# Patient Record
Sex: Female | Born: 1950
Health system: Southern US, Community
[De-identification: ages and names within clinical notes are randomized; demographics above are authoritative.]

## PROBLEM LIST (undated history)

## (undated) DIAGNOSIS — Z9889 Other specified postprocedural states: Secondary | ICD-10-CM

## (undated) DIAGNOSIS — R112 Nausea with vomiting, unspecified: Secondary | ICD-10-CM

## (undated) DIAGNOSIS — T8859XA Other complications of anesthesia, initial encounter: Secondary | ICD-10-CM

## (undated) DIAGNOSIS — E7800 Pure hypercholesterolemia, unspecified: Secondary | ICD-10-CM

## (undated) DIAGNOSIS — I10 Essential (primary) hypertension: Secondary | ICD-10-CM

## (undated) HISTORY — PX: WISDOM TOOTH EXTRACTION: SHX21

## (undated) HISTORY — PX: REPLACEMENT TOTAL KNEE: SUR1224

---

## 2010-09-26 ENCOUNTER — Encounter: Payer: Self-pay | Admitting: Podiatry

## 2013-07-07 ENCOUNTER — Ambulatory Visit (INDEPENDENT_AMBULATORY_CARE_PROVIDER_SITE_OTHER): Payer: Federal, State, Local not specified - PPO | Admitting: Podiatry

## 2013-07-07 VITALS — BP 123/79 | HR 78 | Resp 16

## 2013-07-07 DIAGNOSIS — M7750 Other enthesopathy of unspecified foot: Secondary | ICD-10-CM

## 2013-07-07 MED ORDER — TRIAMCINOLONE ACETONIDE 10 MG/ML IJ SUSP
10.0000 mg | Freq: Once | INTRAMUSCULAR | Status: AC
  Administered 2013-07-07: 10 mg

## 2013-07-07 NOTE — Progress Notes (Signed)
   Subjective:    Patient ID: Debra Mcbride, female    DOB: [birth year], 63 y.o.   MRN: [id number]  HPI  Pain in tops of feet bilaterally, worsens with walking, cortisone shot tx in past was effective   Review of Systems     Objective:   Physical Exam        Assessment & Plan:

## 2013-07-07 NOTE — Progress Notes (Signed)
Subjective:     Patient ID: Debra Mcbride, female   DOB: [birth year], 63 y.o.   MRN: [id number]  HPI patient presents that I'm having a lot of pain in the top of both feet that previous injection was helpful for   Review of Systems     Objective:   Physical Exam Neurological sensation intact with patient well oriented x3 with inflammation and pain midtarsal joint of both feet with fluid buildup around the extensor tendon complex    Assessment:     Extender tendinitis of both feet with inflammation and pain    Plan:     Injected the dorsal extensor complex of both feet 3 mg Kenalog 5 mg Xylocaine Marcaine mixture advised on physical therapy for this area and wider shoe gear

## 2014-05-03 ENCOUNTER — Ambulatory Visit
Admission: RE | Admit: 2014-05-03 | Discharge: 2014-05-03 | Disposition: A | Discharge status: Home / Self Care | Payer: Federal, State, Local not specified - PPO | Source: Ambulatory Visit | Attending: Physician Assistant | Admitting: Physician Assistant

## 2014-05-03 ENCOUNTER — Other Ambulatory Visit: Payer: Self-pay | Admitting: Physician Assistant

## 2014-05-03 DIAGNOSIS — S61451A Open bite of right hand, initial encounter: Secondary | ICD-10-CM

## 2014-05-03 DIAGNOSIS — W5501XA Bitten by cat, initial encounter: Principal | ICD-10-CM

## 2014-10-18 DIAGNOSIS — L0390 Cellulitis, unspecified: Secondary | ICD-10-CM | POA: Diagnosis not present

## 2014-11-06 DIAGNOSIS — L02511 Cutaneous abscess of right hand: Secondary | ICD-10-CM | POA: Diagnosis not present

## 2014-11-22 DIAGNOSIS — L02511 Cutaneous abscess of right hand: Secondary | ICD-10-CM | POA: Diagnosis not present

## 2014-11-22 DIAGNOSIS — L03818 Cellulitis of other sites: Secondary | ICD-10-CM | POA: Diagnosis not present

## 2014-12-01 DIAGNOSIS — L03019 Cellulitis of unspecified finger: Secondary | ICD-10-CM | POA: Diagnosis not present

## 2014-12-05 DIAGNOSIS — M79644 Pain in right finger(s): Secondary | ICD-10-CM | POA: Diagnosis not present

## 2014-12-05 DIAGNOSIS — M150 Primary generalized (osteo)arthritis: Secondary | ICD-10-CM | POA: Diagnosis not present

## 2014-12-05 DIAGNOSIS — M0560 Rheumatoid arthritis of unspecified site with involvement of other organs and systems: Secondary | ICD-10-CM | POA: Diagnosis not present

## 2014-12-05 DIAGNOSIS — L089 Local infection of the skin and subcutaneous tissue, unspecified: Secondary | ICD-10-CM | POA: Diagnosis not present

## 2014-12-05 DIAGNOSIS — R768 Other specified abnormal immunological findings in serum: Secondary | ICD-10-CM | POA: Diagnosis not present

## 2015-01-01 DIAGNOSIS — S61431A Puncture wound without foreign body of right hand, initial encounter: Secondary | ICD-10-CM | POA: Diagnosis not present

## 2015-01-01 DIAGNOSIS — M86641 Other chronic osteomyelitis, right hand: Secondary | ICD-10-CM | POA: Diagnosis not present

## 2015-01-08 DIAGNOSIS — M7989 Other specified soft tissue disorders: Secondary | ICD-10-CM | POA: Diagnosis not present

## 2015-01-09 DIAGNOSIS — M79641 Pain in right hand: Secondary | ICD-10-CM | POA: Diagnosis not present

## 2015-01-15 DIAGNOSIS — S61431A Puncture wound without foreign body of right hand, initial encounter: Secondary | ICD-10-CM | POA: Diagnosis not present

## 2015-01-15 DIAGNOSIS — M86641 Other chronic osteomyelitis, right hand: Secondary | ICD-10-CM | POA: Diagnosis not present

## 2015-02-12 DIAGNOSIS — M86641 Other chronic osteomyelitis, right hand: Secondary | ICD-10-CM | POA: Diagnosis not present

## 2015-03-01 DIAGNOSIS — M79671 Pain in right foot: Secondary | ICD-10-CM | POA: Diagnosis not present

## 2015-03-01 DIAGNOSIS — R768 Other specified abnormal immunological findings in serum: Secondary | ICD-10-CM | POA: Diagnosis not present

## 2015-03-01 DIAGNOSIS — M150 Primary generalized (osteo)arthritis: Secondary | ICD-10-CM | POA: Diagnosis not present

## 2015-03-01 DIAGNOSIS — M0560 Rheumatoid arthritis of unspecified site with involvement of other organs and systems: Secondary | ICD-10-CM | POA: Diagnosis not present

## 2015-04-02 DIAGNOSIS — E7800 Pure hypercholesterolemia, unspecified: Secondary | ICD-10-CM | POA: Diagnosis not present

## 2015-04-02 DIAGNOSIS — I10 Essential (primary) hypertension: Secondary | ICD-10-CM | POA: Diagnosis not present

## 2015-04-02 DIAGNOSIS — G4700 Insomnia, unspecified: Secondary | ICD-10-CM | POA: Diagnosis not present

## 2015-04-16 DIAGNOSIS — Z1231 Encounter for screening mammogram for malignant neoplasm of breast: Secondary | ICD-10-CM | POA: Diagnosis not present

## 2015-04-16 DIAGNOSIS — Z780 Asymptomatic menopausal state: Secondary | ICD-10-CM | POA: Diagnosis not present

## 2015-04-25 DIAGNOSIS — H11442 Conjunctival cysts, left eye: Secondary | ICD-10-CM | POA: Diagnosis not present

## 2015-05-30 DIAGNOSIS — R768 Other specified abnormal immunological findings in serum: Secondary | ICD-10-CM | POA: Diagnosis not present

## 2015-05-30 DIAGNOSIS — M25552 Pain in left hip: Secondary | ICD-10-CM | POA: Diagnosis not present

## 2015-05-30 DIAGNOSIS — M150 Primary generalized (osteo)arthritis: Secondary | ICD-10-CM | POA: Diagnosis not present

## 2015-05-30 DIAGNOSIS — M2550 Pain in unspecified joint: Secondary | ICD-10-CM | POA: Diagnosis not present

## 2015-06-23 DIAGNOSIS — J019 Acute sinusitis, unspecified: Secondary | ICD-10-CM | POA: Diagnosis not present

## 2015-07-18 DIAGNOSIS — M25552 Pain in left hip: Secondary | ICD-10-CM | POA: Diagnosis not present

## 2015-07-18 DIAGNOSIS — M2550 Pain in unspecified joint: Secondary | ICD-10-CM | POA: Diagnosis not present

## 2015-07-18 DIAGNOSIS — R768 Other specified abnormal immunological findings in serum: Secondary | ICD-10-CM | POA: Diagnosis not present

## 2015-07-18 DIAGNOSIS — M5432 Sciatica, left side: Secondary | ICD-10-CM | POA: Diagnosis not present

## 2015-07-18 DIAGNOSIS — M150 Primary generalized (osteo)arthritis: Secondary | ICD-10-CM | POA: Diagnosis not present

## 2015-08-14 DIAGNOSIS — M1711 Unilateral primary osteoarthritis, right knee: Secondary | ICD-10-CM | POA: Diagnosis not present

## 2015-08-14 DIAGNOSIS — M2241 Chondromalacia patellae, right knee: Secondary | ICD-10-CM | POA: Diagnosis not present

## 2015-08-14 DIAGNOSIS — Z6832 Body mass index (BMI) 32.0-32.9, adult: Secondary | ICD-10-CM | POA: Diagnosis not present

## 2015-09-05 DIAGNOSIS — M2550 Pain in unspecified joint: Secondary | ICD-10-CM | POA: Diagnosis not present

## 2015-09-05 DIAGNOSIS — M79671 Pain in right foot: Secondary | ICD-10-CM | POA: Diagnosis not present

## 2015-09-05 DIAGNOSIS — R768 Other specified abnormal immunological findings in serum: Secondary | ICD-10-CM | POA: Diagnosis not present

## 2015-09-05 DIAGNOSIS — M150 Primary generalized (osteo)arthritis: Secondary | ICD-10-CM | POA: Diagnosis not present

## 2015-09-19 ENCOUNTER — Other Ambulatory Visit: Payer: Self-pay | Admitting: Physician Assistant

## 2015-09-19 DIAGNOSIS — M79671 Pain in right foot: Secondary | ICD-10-CM

## 2015-09-24 ENCOUNTER — Ambulatory Visit
Admission: RE | Admit: 2015-09-24 | Discharge: 2015-09-24 | Disposition: A | Discharge status: Home / Self Care | Payer: Medicare Other | Source: Ambulatory Visit | Attending: Physician Assistant | Admitting: Physician Assistant

## 2015-09-24 DIAGNOSIS — M79671 Pain in right foot: Secondary | ICD-10-CM | POA: Diagnosis not present

## 2015-09-27 ENCOUNTER — Other Ambulatory Visit: Payer: Federal, State, Local not specified - PPO

## 2015-09-27 DIAGNOSIS — S92354A Nondisplaced fracture of fifth metatarsal bone, right foot, initial encounter for closed fracture: Secondary | ICD-10-CM | POA: Diagnosis not present

## 2015-10-01 DIAGNOSIS — E7800 Pure hypercholesterolemia, unspecified: Secondary | ICD-10-CM | POA: Diagnosis not present

## 2015-10-01 DIAGNOSIS — G4700 Insomnia, unspecified: Secondary | ICD-10-CM | POA: Diagnosis not present

## 2015-10-01 DIAGNOSIS — I10 Essential (primary) hypertension: Secondary | ICD-10-CM | POA: Diagnosis not present

## 2015-10-01 DIAGNOSIS — F39 Unspecified mood [affective] disorder: Secondary | ICD-10-CM | POA: Diagnosis not present

## 2015-10-01 DIAGNOSIS — K580 Irritable bowel syndrome with diarrhea: Secondary | ICD-10-CM | POA: Diagnosis not present

## 2015-10-01 DIAGNOSIS — Z23 Encounter for immunization: Secondary | ICD-10-CM | POA: Diagnosis not present

## 2015-10-01 DIAGNOSIS — Z0000 Encounter for general adult medical examination without abnormal findings: Secondary | ICD-10-CM | POA: Diagnosis not present

## 2015-10-15 DIAGNOSIS — M150 Primary generalized (osteo)arthritis: Secondary | ICD-10-CM | POA: Diagnosis not present

## 2015-10-15 DIAGNOSIS — M791 Myalgia: Secondary | ICD-10-CM | POA: Diagnosis not present

## 2015-10-15 DIAGNOSIS — R768 Other specified abnormal immunological findings in serum: Secondary | ICD-10-CM | POA: Diagnosis not present

## 2015-10-15 DIAGNOSIS — M79671 Pain in right foot: Secondary | ICD-10-CM | POA: Diagnosis not present

## 2015-10-15 DIAGNOSIS — M2550 Pain in unspecified joint: Secondary | ICD-10-CM | POA: Diagnosis not present

## 2015-10-19 DIAGNOSIS — S92354D Nondisplaced fracture of fifth metatarsal bone, right foot, subsequent encounter for fracture with routine healing: Secondary | ICD-10-CM | POA: Diagnosis not present

## 2015-11-14 DIAGNOSIS — S92354D Nondisplaced fracture of fifth metatarsal bone, right foot, subsequent encounter for fracture with routine healing: Secondary | ICD-10-CM | POA: Diagnosis not present

## 2015-12-03 DIAGNOSIS — M1711 Unilateral primary osteoarthritis, right knee: Secondary | ICD-10-CM | POA: Diagnosis not present

## 2016-01-22 DIAGNOSIS — Z23 Encounter for immunization: Secondary | ICD-10-CM | POA: Diagnosis not present

## 2016-01-29 DIAGNOSIS — M150 Primary generalized (osteo)arthritis: Secondary | ICD-10-CM | POA: Diagnosis not present

## 2016-01-29 DIAGNOSIS — R768 Other specified abnormal immunological findings in serum: Secondary | ICD-10-CM | POA: Diagnosis not present

## 2016-01-29 DIAGNOSIS — E669 Obesity, unspecified: Secondary | ICD-10-CM | POA: Diagnosis not present

## 2016-01-29 DIAGNOSIS — M79671 Pain in right foot: Secondary | ICD-10-CM | POA: Diagnosis not present

## 2016-01-29 DIAGNOSIS — Z6832 Body mass index (BMI) 32.0-32.9, adult: Secondary | ICD-10-CM | POA: Diagnosis not present

## 2016-01-29 DIAGNOSIS — M791 Myalgia: Secondary | ICD-10-CM | POA: Diagnosis not present

## 2016-01-29 DIAGNOSIS — M2550 Pain in unspecified joint: Secondary | ICD-10-CM | POA: Diagnosis not present

## 2016-02-04 DIAGNOSIS — M1711 Unilateral primary osteoarthritis, right knee: Secondary | ICD-10-CM | POA: Diagnosis not present

## 2016-02-04 DIAGNOSIS — I10 Essential (primary) hypertension: Secondary | ICD-10-CM | POA: Diagnosis not present

## 2016-02-13 DIAGNOSIS — Z8614 Personal history of Methicillin resistant Staphylococcus aureus infection: Secondary | ICD-10-CM | POA: Diagnosis not present

## 2016-02-13 DIAGNOSIS — I10 Essential (primary) hypertension: Secondary | ICD-10-CM | POA: Diagnosis present

## 2016-02-13 DIAGNOSIS — M1711 Unilateral primary osteoarthritis, right knee: Secondary | ICD-10-CM | POA: Diagnosis not present

## 2016-02-13 DIAGNOSIS — K219 Gastro-esophageal reflux disease without esophagitis: Secondary | ICD-10-CM | POA: Diagnosis present

## 2016-02-13 DIAGNOSIS — M25362 Other instability, left knee: Secondary | ICD-10-CM | POA: Diagnosis present

## 2016-02-13 DIAGNOSIS — Z96651 Presence of right artificial knee joint: Secondary | ICD-10-CM | POA: Diagnosis not present

## 2016-02-13 DIAGNOSIS — G8918 Other acute postprocedural pain: Secondary | ICD-10-CM | POA: Diagnosis not present

## 2016-02-13 DIAGNOSIS — R2689 Other abnormalities of gait and mobility: Secondary | ICD-10-CM | POA: Diagnosis not present

## 2016-02-13 DIAGNOSIS — Z471 Aftercare following joint replacement surgery: Secondary | ICD-10-CM | POA: Diagnosis not present

## 2016-02-13 DIAGNOSIS — F419 Anxiety disorder, unspecified: Secondary | ICD-10-CM | POA: Diagnosis present

## 2016-02-13 DIAGNOSIS — M21061 Valgus deformity, not elsewhere classified, right knee: Secondary | ICD-10-CM | POA: Diagnosis present

## 2016-02-13 DIAGNOSIS — M6281 Muscle weakness (generalized): Secondary | ICD-10-CM | POA: Diagnosis not present

## 2016-02-15 DIAGNOSIS — Z471 Aftercare following joint replacement surgery: Secondary | ICD-10-CM | POA: Diagnosis not present

## 2016-02-15 DIAGNOSIS — R2689 Other abnormalities of gait and mobility: Secondary | ICD-10-CM | POA: Diagnosis not present

## 2016-02-15 DIAGNOSIS — I10 Essential (primary) hypertension: Secondary | ICD-10-CM | POA: Diagnosis not present

## 2016-02-15 DIAGNOSIS — Z96651 Presence of right artificial knee joint: Secondary | ICD-10-CM | POA: Diagnosis not present

## 2016-02-15 DIAGNOSIS — M6281 Muscle weakness (generalized): Secondary | ICD-10-CM | POA: Diagnosis not present

## 2016-02-18 DIAGNOSIS — R2689 Other abnormalities of gait and mobility: Secondary | ICD-10-CM | POA: Diagnosis not present

## 2016-02-18 DIAGNOSIS — I10 Essential (primary) hypertension: Secondary | ICD-10-CM | POA: Diagnosis not present

## 2016-02-18 DIAGNOSIS — Z471 Aftercare following joint replacement surgery: Secondary | ICD-10-CM | POA: Diagnosis not present

## 2016-02-18 DIAGNOSIS — Z96651 Presence of right artificial knee joint: Secondary | ICD-10-CM | POA: Diagnosis not present

## 2016-02-18 DIAGNOSIS — M6281 Muscle weakness (generalized): Secondary | ICD-10-CM | POA: Diagnosis not present

## 2016-02-20 DIAGNOSIS — M6281 Muscle weakness (generalized): Secondary | ICD-10-CM | POA: Diagnosis not present

## 2016-02-20 DIAGNOSIS — I10 Essential (primary) hypertension: Secondary | ICD-10-CM | POA: Diagnosis not present

## 2016-02-20 DIAGNOSIS — Z471 Aftercare following joint replacement surgery: Secondary | ICD-10-CM | POA: Diagnosis not present

## 2016-02-20 DIAGNOSIS — R2689 Other abnormalities of gait and mobility: Secondary | ICD-10-CM | POA: Diagnosis not present

## 2016-02-20 DIAGNOSIS — Z96651 Presence of right artificial knee joint: Secondary | ICD-10-CM | POA: Diagnosis not present

## 2016-02-22 DIAGNOSIS — I10 Essential (primary) hypertension: Secondary | ICD-10-CM | POA: Diagnosis not present

## 2016-02-22 DIAGNOSIS — Z471 Aftercare following joint replacement surgery: Secondary | ICD-10-CM | POA: Diagnosis not present

## 2016-02-22 DIAGNOSIS — M6281 Muscle weakness (generalized): Secondary | ICD-10-CM | POA: Diagnosis not present

## 2016-02-22 DIAGNOSIS — Z96651 Presence of right artificial knee joint: Secondary | ICD-10-CM | POA: Diagnosis not present

## 2016-02-22 DIAGNOSIS — R2689 Other abnormalities of gait and mobility: Secondary | ICD-10-CM | POA: Diagnosis not present

## 2016-02-26 DIAGNOSIS — Z96651 Presence of right artificial knee joint: Secondary | ICD-10-CM | POA: Diagnosis not present

## 2016-02-26 DIAGNOSIS — I10 Essential (primary) hypertension: Secondary | ICD-10-CM | POA: Diagnosis not present

## 2016-02-26 DIAGNOSIS — R2689 Other abnormalities of gait and mobility: Secondary | ICD-10-CM | POA: Diagnosis not present

## 2016-02-26 DIAGNOSIS — Z471 Aftercare following joint replacement surgery: Secondary | ICD-10-CM | POA: Diagnosis not present

## 2016-02-26 DIAGNOSIS — M6281 Muscle weakness (generalized): Secondary | ICD-10-CM | POA: Diagnosis not present

## 2016-02-28 DIAGNOSIS — Z471 Aftercare following joint replacement surgery: Secondary | ICD-10-CM | POA: Diagnosis not present

## 2016-02-28 DIAGNOSIS — M6281 Muscle weakness (generalized): Secondary | ICD-10-CM | POA: Diagnosis not present

## 2016-02-28 DIAGNOSIS — Z96651 Presence of right artificial knee joint: Secondary | ICD-10-CM | POA: Diagnosis not present

## 2016-02-28 DIAGNOSIS — R2689 Other abnormalities of gait and mobility: Secondary | ICD-10-CM | POA: Diagnosis not present

## 2016-02-28 DIAGNOSIS — I10 Essential (primary) hypertension: Secondary | ICD-10-CM | POA: Diagnosis not present

## 2016-02-29 DIAGNOSIS — I10 Essential (primary) hypertension: Secondary | ICD-10-CM | POA: Diagnosis not present

## 2016-02-29 DIAGNOSIS — M6281 Muscle weakness (generalized): Secondary | ICD-10-CM | POA: Diagnosis not present

## 2016-02-29 DIAGNOSIS — Z96651 Presence of right artificial knee joint: Secondary | ICD-10-CM | POA: Diagnosis not present

## 2016-02-29 DIAGNOSIS — R2689 Other abnormalities of gait and mobility: Secondary | ICD-10-CM | POA: Diagnosis not present

## 2016-02-29 DIAGNOSIS — Z471 Aftercare following joint replacement surgery: Secondary | ICD-10-CM | POA: Diagnosis not present

## 2016-03-04 DIAGNOSIS — Z96651 Presence of right artificial knee joint: Secondary | ICD-10-CM | POA: Diagnosis not present

## 2016-03-04 DIAGNOSIS — M6281 Muscle weakness (generalized): Secondary | ICD-10-CM | POA: Diagnosis not present

## 2016-03-04 DIAGNOSIS — R2689 Other abnormalities of gait and mobility: Secondary | ICD-10-CM | POA: Diagnosis not present

## 2016-03-04 DIAGNOSIS — Z471 Aftercare following joint replacement surgery: Secondary | ICD-10-CM | POA: Diagnosis not present

## 2016-03-04 DIAGNOSIS — I10 Essential (primary) hypertension: Secondary | ICD-10-CM | POA: Diagnosis not present

## 2016-03-05 DIAGNOSIS — Z471 Aftercare following joint replacement surgery: Secondary | ICD-10-CM | POA: Diagnosis not present

## 2016-03-05 DIAGNOSIS — M6281 Muscle weakness (generalized): Secondary | ICD-10-CM | POA: Diagnosis not present

## 2016-03-05 DIAGNOSIS — R2689 Other abnormalities of gait and mobility: Secondary | ICD-10-CM | POA: Diagnosis not present

## 2016-03-05 DIAGNOSIS — Z96651 Presence of right artificial knee joint: Secondary | ICD-10-CM | POA: Diagnosis not present

## 2016-03-05 DIAGNOSIS — I10 Essential (primary) hypertension: Secondary | ICD-10-CM | POA: Diagnosis not present

## 2016-03-07 DIAGNOSIS — R2689 Other abnormalities of gait and mobility: Secondary | ICD-10-CM | POA: Diagnosis not present

## 2016-03-07 DIAGNOSIS — Z96651 Presence of right artificial knee joint: Secondary | ICD-10-CM | POA: Diagnosis not present

## 2016-03-07 DIAGNOSIS — Z471 Aftercare following joint replacement surgery: Secondary | ICD-10-CM | POA: Diagnosis not present

## 2016-03-07 DIAGNOSIS — I10 Essential (primary) hypertension: Secondary | ICD-10-CM | POA: Diagnosis not present

## 2016-03-07 DIAGNOSIS — M6281 Muscle weakness (generalized): Secondary | ICD-10-CM | POA: Diagnosis not present

## 2016-03-10 DIAGNOSIS — R2689 Other abnormalities of gait and mobility: Secondary | ICD-10-CM | POA: Diagnosis not present

## 2016-03-10 DIAGNOSIS — Z96651 Presence of right artificial knee joint: Secondary | ICD-10-CM | POA: Diagnosis not present

## 2016-03-10 DIAGNOSIS — Z471 Aftercare following joint replacement surgery: Secondary | ICD-10-CM | POA: Diagnosis not present

## 2016-03-10 DIAGNOSIS — I10 Essential (primary) hypertension: Secondary | ICD-10-CM | POA: Diagnosis not present

## 2016-03-10 DIAGNOSIS — M6281 Muscle weakness (generalized): Secondary | ICD-10-CM | POA: Diagnosis not present

## 2016-03-12 DIAGNOSIS — Z471 Aftercare following joint replacement surgery: Secondary | ICD-10-CM | POA: Diagnosis not present

## 2016-03-12 DIAGNOSIS — R2689 Other abnormalities of gait and mobility: Secondary | ICD-10-CM | POA: Diagnosis not present

## 2016-03-12 DIAGNOSIS — M6281 Muscle weakness (generalized): Secondary | ICD-10-CM | POA: Diagnosis not present

## 2016-03-12 DIAGNOSIS — Z96651 Presence of right artificial knee joint: Secondary | ICD-10-CM | POA: Diagnosis not present

## 2016-03-12 DIAGNOSIS — I10 Essential (primary) hypertension: Secondary | ICD-10-CM | POA: Diagnosis not present

## 2016-03-17 DIAGNOSIS — M25661 Stiffness of right knee, not elsewhere classified: Secondary | ICD-10-CM | POA: Diagnosis not present

## 2016-03-17 DIAGNOSIS — M25561 Pain in right knee: Secondary | ICD-10-CM | POA: Diagnosis not present

## 2016-03-17 DIAGNOSIS — R29898 Other symptoms and signs involving the musculoskeletal system: Secondary | ICD-10-CM | POA: Diagnosis not present

## 2016-03-21 DIAGNOSIS — M25561 Pain in right knee: Secondary | ICD-10-CM | POA: Diagnosis not present

## 2016-03-21 DIAGNOSIS — R29898 Other symptoms and signs involving the musculoskeletal system: Secondary | ICD-10-CM | POA: Diagnosis not present

## 2016-03-21 DIAGNOSIS — M25661 Stiffness of right knee, not elsewhere classified: Secondary | ICD-10-CM | POA: Diagnosis not present

## 2016-03-26 DIAGNOSIS — M25661 Stiffness of right knee, not elsewhere classified: Secondary | ICD-10-CM | POA: Diagnosis not present

## 2016-03-26 DIAGNOSIS — R29898 Other symptoms and signs involving the musculoskeletal system: Secondary | ICD-10-CM | POA: Diagnosis not present

## 2016-03-26 DIAGNOSIS — M25561 Pain in right knee: Secondary | ICD-10-CM | POA: Diagnosis not present

## 2016-03-27 DIAGNOSIS — Z471 Aftercare following joint replacement surgery: Secondary | ICD-10-CM | POA: Diagnosis not present

## 2016-03-27 DIAGNOSIS — Z96651 Presence of right artificial knee joint: Secondary | ICD-10-CM | POA: Diagnosis not present

## 2016-03-31 DIAGNOSIS — M79671 Pain in right foot: Secondary | ICD-10-CM | POA: Diagnosis not present

## 2016-03-31 DIAGNOSIS — R29898 Other symptoms and signs involving the musculoskeletal system: Secondary | ICD-10-CM | POA: Diagnosis not present

## 2016-03-31 DIAGNOSIS — M2550 Pain in unspecified joint: Secondary | ICD-10-CM | POA: Diagnosis not present

## 2016-03-31 DIAGNOSIS — Z6831 Body mass index (BMI) 31.0-31.9, adult: Secondary | ICD-10-CM | POA: Diagnosis not present

## 2016-03-31 DIAGNOSIS — M25561 Pain in right knee: Secondary | ICD-10-CM | POA: Diagnosis not present

## 2016-03-31 DIAGNOSIS — M150 Primary generalized (osteo)arthritis: Secondary | ICD-10-CM | POA: Diagnosis not present

## 2016-03-31 DIAGNOSIS — M25661 Stiffness of right knee, not elsewhere classified: Secondary | ICD-10-CM | POA: Diagnosis not present

## 2016-03-31 DIAGNOSIS — R768 Other specified abnormal immunological findings in serum: Secondary | ICD-10-CM | POA: Diagnosis not present

## 2016-03-31 DIAGNOSIS — E669 Obesity, unspecified: Secondary | ICD-10-CM | POA: Diagnosis not present

## 2016-04-18 DIAGNOSIS — I10 Essential (primary) hypertension: Secondary | ICD-10-CM | POA: Diagnosis not present

## 2016-04-18 DIAGNOSIS — F39 Unspecified mood [affective] disorder: Secondary | ICD-10-CM | POA: Diagnosis not present

## 2016-04-18 DIAGNOSIS — G4700 Insomnia, unspecified: Secondary | ICD-10-CM | POA: Diagnosis not present

## 2016-04-18 DIAGNOSIS — E7800 Pure hypercholesterolemia, unspecified: Secondary | ICD-10-CM | POA: Diagnosis not present

## 2016-07-09 DIAGNOSIS — R109 Unspecified abdominal pain: Secondary | ICD-10-CM | POA: Diagnosis not present

## 2016-07-16 DIAGNOSIS — J029 Acute pharyngitis, unspecified: Secondary | ICD-10-CM | POA: Diagnosis not present

## 2016-07-16 DIAGNOSIS — Z6831 Body mass index (BMI) 31.0-31.9, adult: Secondary | ICD-10-CM | POA: Diagnosis not present

## 2016-07-16 DIAGNOSIS — J019 Acute sinusitis, unspecified: Secondary | ICD-10-CM | POA: Diagnosis not present

## 2016-07-25 DIAGNOSIS — M62830 Muscle spasm of back: Secondary | ICD-10-CM | POA: Diagnosis not present

## 2016-07-31 DIAGNOSIS — M542 Cervicalgia: Secondary | ICD-10-CM | POA: Diagnosis not present

## 2016-07-31 DIAGNOSIS — M545 Low back pain: Secondary | ICD-10-CM | POA: Diagnosis not present

## 2016-07-31 DIAGNOSIS — M546 Pain in thoracic spine: Secondary | ICD-10-CM | POA: Diagnosis not present

## 2016-08-05 DIAGNOSIS — M546 Pain in thoracic spine: Secondary | ICD-10-CM | POA: Diagnosis not present

## 2016-08-05 DIAGNOSIS — M545 Low back pain: Secondary | ICD-10-CM | POA: Diagnosis not present

## 2016-08-05 DIAGNOSIS — M542 Cervicalgia: Secondary | ICD-10-CM | POA: Diagnosis not present

## 2016-08-11 DIAGNOSIS — M545 Low back pain: Secondary | ICD-10-CM | POA: Diagnosis not present

## 2016-08-11 DIAGNOSIS — M542 Cervicalgia: Secondary | ICD-10-CM | POA: Diagnosis not present

## 2016-08-11 DIAGNOSIS — M546 Pain in thoracic spine: Secondary | ICD-10-CM | POA: Diagnosis not present

## 2016-08-12 ENCOUNTER — Other Ambulatory Visit: Payer: Self-pay | Admitting: Family Medicine

## 2016-08-12 ENCOUNTER — Ambulatory Visit
Admission: RE | Admit: 2016-08-12 | Discharge: 2016-08-12 | Disposition: A | Discharge status: Home / Self Care | Payer: Medicare Other | Source: Ambulatory Visit | Attending: Family Medicine | Admitting: Family Medicine

## 2016-08-12 DIAGNOSIS — R059 Cough, unspecified: Secondary | ICD-10-CM

## 2016-08-12 DIAGNOSIS — R079 Chest pain, unspecified: Secondary | ICD-10-CM

## 2016-08-12 DIAGNOSIS — R05 Cough: Secondary | ICD-10-CM

## 2016-08-14 ENCOUNTER — Other Ambulatory Visit: Payer: Self-pay | Admitting: Physician Assistant

## 2016-08-14 DIAGNOSIS — M546 Pain in thoracic spine: Secondary | ICD-10-CM

## 2016-08-14 DIAGNOSIS — R109 Unspecified abdominal pain: Secondary | ICD-10-CM

## 2016-08-14 DIAGNOSIS — R8290 Unspecified abnormal findings in urine: Secondary | ICD-10-CM | POA: Diagnosis not present

## 2016-08-14 DIAGNOSIS — M545 Low back pain: Secondary | ICD-10-CM | POA: Diagnosis not present

## 2016-08-18 ENCOUNTER — Ambulatory Visit
Admission: RE | Admit: 2016-08-18 | Discharge: 2016-08-18 | Disposition: A | Discharge status: Home / Self Care | Payer: Medicare Other | Source: Ambulatory Visit | Attending: Physician Assistant | Admitting: Physician Assistant

## 2016-08-18 DIAGNOSIS — R079 Chest pain, unspecified: Secondary | ICD-10-CM | POA: Diagnosis not present

## 2016-08-18 DIAGNOSIS — R109 Unspecified abdominal pain: Secondary | ICD-10-CM

## 2016-08-18 DIAGNOSIS — K8020 Calculus of gallbladder without cholecystitis without obstruction: Secondary | ICD-10-CM | POA: Diagnosis not present

## 2016-08-18 DIAGNOSIS — M546 Pain in thoracic spine: Secondary | ICD-10-CM

## 2016-08-18 MED ORDER — IOPAMIDOL (ISOVUE-300) INJECTION 61%
100.0000 mL | Freq: Once | INTRAVENOUS | Status: AC | PRN
  Administered 2016-08-18: 100 mL via INTRAVENOUS

## 2016-09-02 DIAGNOSIS — M546 Pain in thoracic spine: Secondary | ICD-10-CM | POA: Diagnosis not present

## 2016-09-02 DIAGNOSIS — M545 Low back pain: Secondary | ICD-10-CM | POA: Diagnosis not present

## 2016-09-02 DIAGNOSIS — M542 Cervicalgia: Secondary | ICD-10-CM | POA: Diagnosis not present

## 2016-11-05 DIAGNOSIS — B372 Candidiasis of skin and nail: Secondary | ICD-10-CM | POA: Diagnosis not present

## 2016-11-05 DIAGNOSIS — R5383 Other fatigue: Secondary | ICD-10-CM | POA: Diagnosis not present

## 2016-11-05 DIAGNOSIS — R05 Cough: Secondary | ICD-10-CM | POA: Diagnosis not present

## 2016-11-05 DIAGNOSIS — L309 Dermatitis, unspecified: Secondary | ICD-10-CM | POA: Diagnosis not present

## 2016-11-05 DIAGNOSIS — I10 Essential (primary) hypertension: Secondary | ICD-10-CM | POA: Diagnosis not present

## 2016-11-05 DIAGNOSIS — R0609 Other forms of dyspnea: Secondary | ICD-10-CM | POA: Diagnosis not present

## 2016-11-07 DIAGNOSIS — R945 Abnormal results of liver function studies: Secondary | ICD-10-CM | POA: Diagnosis not present

## 2016-11-07 DIAGNOSIS — M2550 Pain in unspecified joint: Secondary | ICD-10-CM | POA: Diagnosis not present

## 2016-11-07 DIAGNOSIS — G4700 Insomnia, unspecified: Secondary | ICD-10-CM | POA: Diagnosis not present

## 2016-11-07 DIAGNOSIS — R5383 Other fatigue: Secondary | ICD-10-CM | POA: Diagnosis not present

## 2016-11-26 DIAGNOSIS — R945 Abnormal results of liver function studies: Secondary | ICD-10-CM | POA: Diagnosis not present

## 2016-12-09 DIAGNOSIS — Z1231 Encounter for screening mammogram for malignant neoplasm of breast: Secondary | ICD-10-CM | POA: Diagnosis not present

## 2016-12-18 DIAGNOSIS — B372 Candidiasis of skin and nail: Secondary | ICD-10-CM | POA: Diagnosis not present

## 2017-01-06 DIAGNOSIS — E669 Obesity, unspecified: Secondary | ICD-10-CM | POA: Diagnosis not present

## 2017-01-06 DIAGNOSIS — Z6832 Body mass index (BMI) 32.0-32.9, adult: Secondary | ICD-10-CM | POA: Diagnosis not present

## 2017-01-06 DIAGNOSIS — M2550 Pain in unspecified joint: Secondary | ICD-10-CM | POA: Diagnosis not present

## 2017-01-06 DIAGNOSIS — R768 Other specified abnormal immunological findings in serum: Secondary | ICD-10-CM | POA: Diagnosis not present

## 2017-01-06 DIAGNOSIS — M150 Primary generalized (osteo)arthritis: Secondary | ICD-10-CM | POA: Diagnosis not present

## 2017-01-06 DIAGNOSIS — M79671 Pain in right foot: Secondary | ICD-10-CM | POA: Diagnosis not present

## 2017-01-06 DIAGNOSIS — Z23 Encounter for immunization: Secondary | ICD-10-CM | POA: Diagnosis not present

## 2017-01-26 DIAGNOSIS — R0981 Nasal congestion: Secondary | ICD-10-CM | POA: Diagnosis not present

## 2017-01-26 DIAGNOSIS — G4700 Insomnia, unspecified: Secondary | ICD-10-CM | POA: Diagnosis not present

## 2017-01-26 DIAGNOSIS — I10 Essential (primary) hypertension: Secondary | ICD-10-CM | POA: Diagnosis not present

## 2017-01-26 DIAGNOSIS — Z23 Encounter for immunization: Secondary | ICD-10-CM | POA: Diagnosis not present

## 2017-01-26 DIAGNOSIS — E7800 Pure hypercholesterolemia, unspecified: Secondary | ICD-10-CM | POA: Diagnosis not present

## 2017-01-26 DIAGNOSIS — Z0000 Encounter for general adult medical examination without abnormal findings: Secondary | ICD-10-CM | POA: Diagnosis not present

## 2017-01-26 DIAGNOSIS — K580 Irritable bowel syndrome with diarrhea: Secondary | ICD-10-CM | POA: Diagnosis not present

## 2017-01-26 DIAGNOSIS — F39 Unspecified mood [affective] disorder: Secondary | ICD-10-CM | POA: Diagnosis not present

## 2017-01-26 DIAGNOSIS — M2550 Pain in unspecified joint: Secondary | ICD-10-CM | POA: Diagnosis not present

## 2017-01-26 DIAGNOSIS — Z1389 Encounter for screening for other disorder: Secondary | ICD-10-CM | POA: Diagnosis not present

## 2017-04-06 DIAGNOSIS — M2550 Pain in unspecified joint: Secondary | ICD-10-CM | POA: Diagnosis not present

## 2017-04-06 DIAGNOSIS — Z6832 Body mass index (BMI) 32.0-32.9, adult: Secondary | ICD-10-CM | POA: Diagnosis not present

## 2017-04-06 DIAGNOSIS — M79671 Pain in right foot: Secondary | ICD-10-CM | POA: Diagnosis not present

## 2017-04-06 DIAGNOSIS — M150 Primary generalized (osteo)arthritis: Secondary | ICD-10-CM | POA: Diagnosis not present

## 2017-04-06 DIAGNOSIS — Z79899 Other long term (current) drug therapy: Secondary | ICD-10-CM | POA: Diagnosis not present

## 2017-04-06 DIAGNOSIS — R768 Other specified abnormal immunological findings in serum: Secondary | ICD-10-CM | POA: Diagnosis not present

## 2017-05-14 DIAGNOSIS — D225 Melanocytic nevi of trunk: Secondary | ICD-10-CM | POA: Diagnosis not present

## 2017-05-14 DIAGNOSIS — L821 Other seborrheic keratosis: Secondary | ICD-10-CM | POA: Diagnosis not present

## 2017-05-14 DIAGNOSIS — L814 Other melanin hyperpigmentation: Secondary | ICD-10-CM | POA: Diagnosis not present

## 2017-05-14 DIAGNOSIS — D485 Neoplasm of uncertain behavior of skin: Secondary | ICD-10-CM | POA: Diagnosis not present

## 2017-06-12 DIAGNOSIS — R05 Cough: Secondary | ICD-10-CM | POA: Diagnosis not present

## 2017-06-12 DIAGNOSIS — J0100 Acute maxillary sinusitis, unspecified: Secondary | ICD-10-CM | POA: Diagnosis not present

## 2017-08-06 DIAGNOSIS — I872 Venous insufficiency (chronic) (peripheral): Secondary | ICD-10-CM | POA: Diagnosis not present

## 2017-08-06 DIAGNOSIS — E7800 Pure hypercholesterolemia, unspecified: Secondary | ICD-10-CM | POA: Diagnosis not present

## 2017-08-06 DIAGNOSIS — I10 Essential (primary) hypertension: Secondary | ICD-10-CM | POA: Diagnosis not present

## 2017-10-08 DIAGNOSIS — Z79899 Other long term (current) drug therapy: Secondary | ICD-10-CM | POA: Diagnosis not present

## 2017-10-08 DIAGNOSIS — R768 Other specified abnormal immunological findings in serum: Secondary | ICD-10-CM | POA: Diagnosis not present

## 2017-10-08 DIAGNOSIS — Z6832 Body mass index (BMI) 32.0-32.9, adult: Secondary | ICD-10-CM | POA: Diagnosis not present

## 2017-10-08 DIAGNOSIS — M79671 Pain in right foot: Secondary | ICD-10-CM | POA: Diagnosis not present

## 2017-10-08 DIAGNOSIS — M2550 Pain in unspecified joint: Secondary | ICD-10-CM | POA: Diagnosis not present

## 2017-10-08 DIAGNOSIS — M150 Primary generalized (osteo)arthritis: Secondary | ICD-10-CM | POA: Diagnosis not present

## 2018-01-04 DIAGNOSIS — Z1231 Encounter for screening mammogram for malignant neoplasm of breast: Secondary | ICD-10-CM | POA: Diagnosis not present

## 2018-04-07 DIAGNOSIS — Z79899 Other long term (current) drug therapy: Secondary | ICD-10-CM | POA: Diagnosis not present

## 2018-04-07 DIAGNOSIS — M2550 Pain in unspecified joint: Secondary | ICD-10-CM | POA: Diagnosis not present

## 2018-04-07 DIAGNOSIS — E669 Obesity, unspecified: Secondary | ICD-10-CM | POA: Diagnosis not present

## 2018-04-07 DIAGNOSIS — Z6833 Body mass index (BMI) 33.0-33.9, adult: Secondary | ICD-10-CM | POA: Diagnosis not present

## 2018-04-07 DIAGNOSIS — R768 Other specified abnormal immunological findings in serum: Secondary | ICD-10-CM | POA: Diagnosis not present

## 2018-04-07 DIAGNOSIS — M150 Primary generalized (osteo)arthritis: Secondary | ICD-10-CM | POA: Diagnosis not present

## 2018-04-07 DIAGNOSIS — M79671 Pain in right foot: Secondary | ICD-10-CM | POA: Diagnosis not present

## 2018-04-26 DIAGNOSIS — Z0000 Encounter for general adult medical examination without abnormal findings: Secondary | ICD-10-CM | POA: Diagnosis not present

## 2018-05-28 IMAGING — CT CT ABD-PELV W/ CM
2 of 5 series · 11 of 36 positions shown, 18 images · IV contrast (iopamidol)
Comparison: None.

CLINICAL DATA: 65 y/o F; left flank pain, left lower back pain,
thoracic pain, upper back pain.

EXAM:
CT CHEST, ABDOMEN, AND PELVIS WITH CONTRAST
TECHNIQUE: Multidetector CT imaging of the chest, abdomen and pelvis was
performed following the standard protocol during bolus
administration of intravenous contrast.
CONTRAST:  100mL W7B3AW-7MM IOPAMIDOL (W7B3AW-7MM) INJECTION 61%

[Series 601: coronal body · coronal · 1.12mm/px · 1 of 136 slices shown, 2 images]
[im 46/136  soft-tissue]
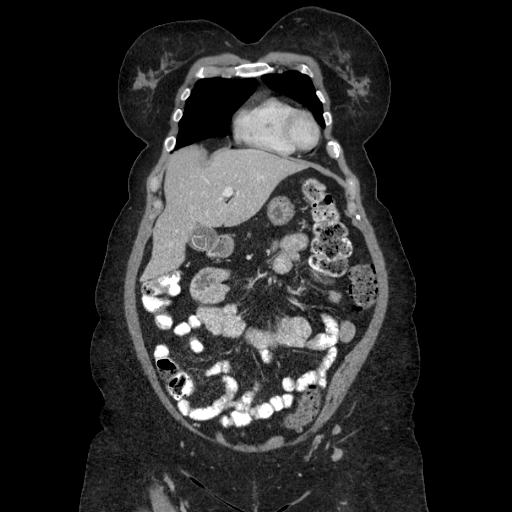
[im 46/136  bone]
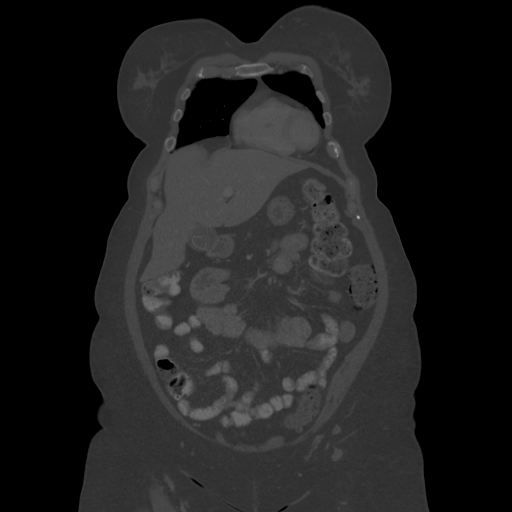

[Series 602: sagittal body · sagittal · 1.12mm/px · 10 of 189 slices shown, 16 images]
[im 16/189  soft-tissue]
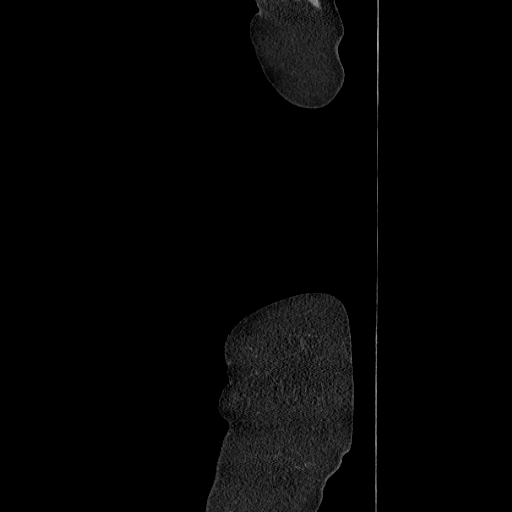
[im 16/189  lung]
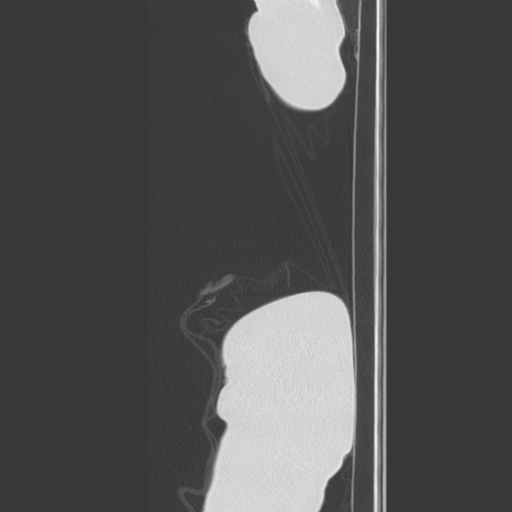
[im 16/189  bone]
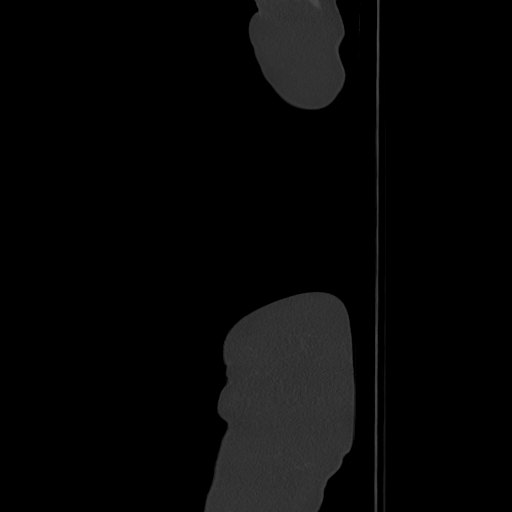
[im 32/189  soft-tissue]
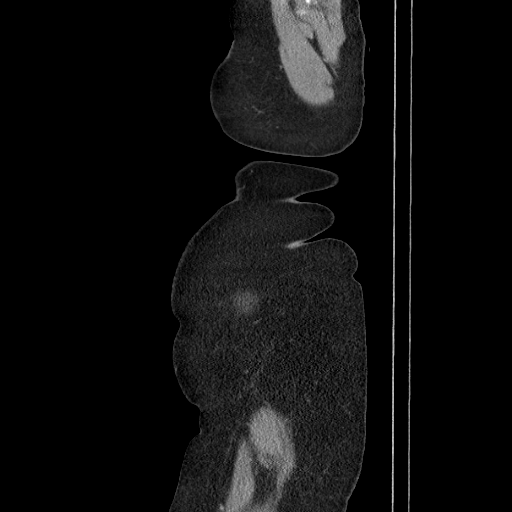
[im 32/189  lung]
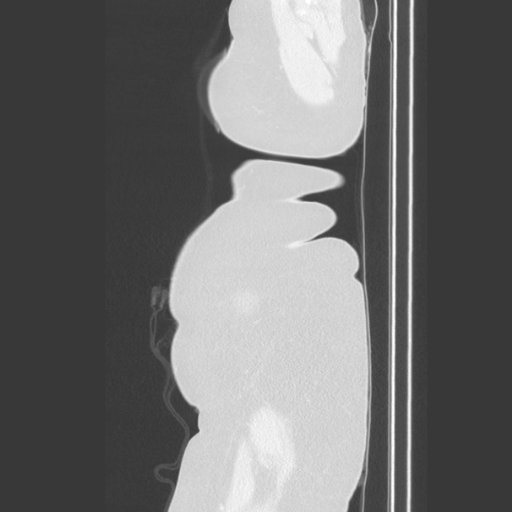
[im 48/189  soft-tissue]
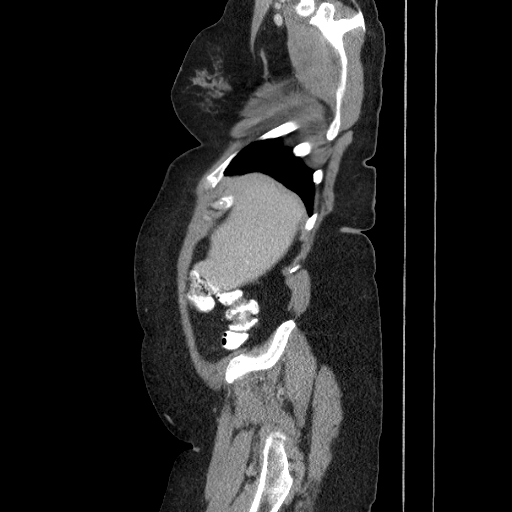
[im 48/189  lung]
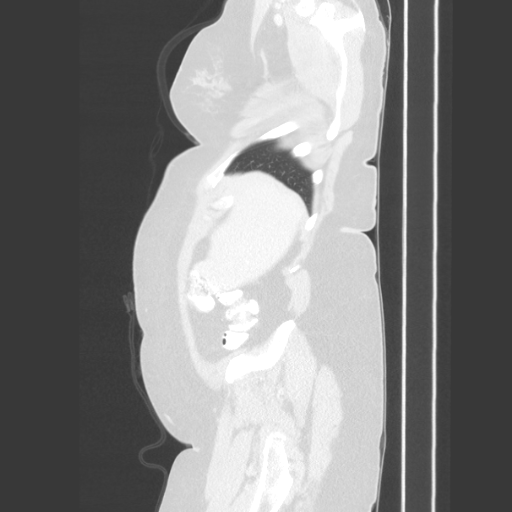
[im 63/189  soft-tissue]
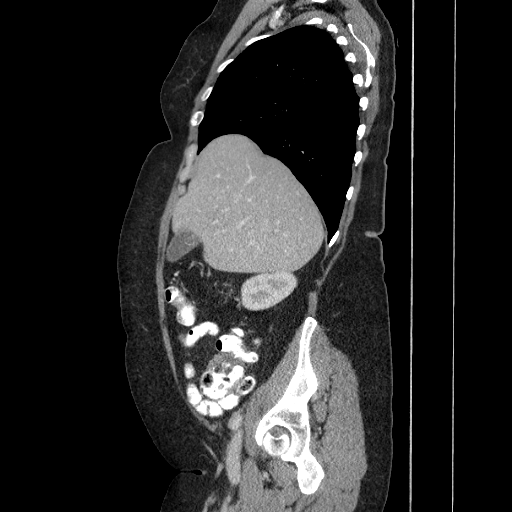
[im 63/189  lung]
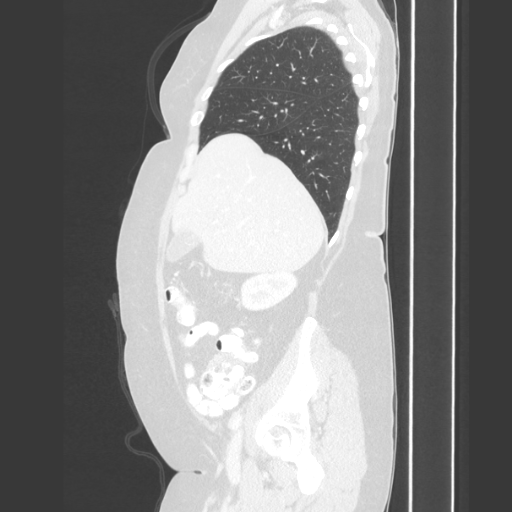
[im 79/189  soft-tissue]
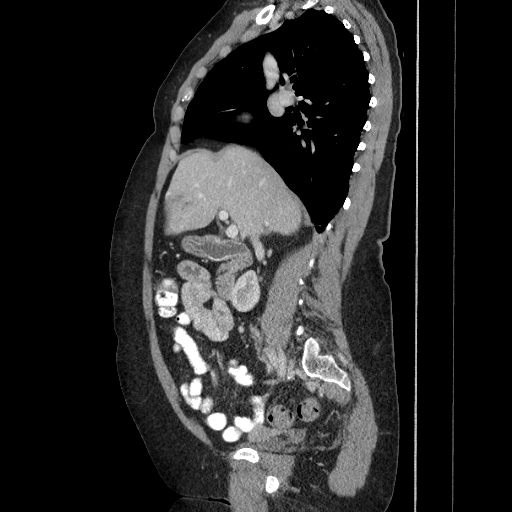
[im 110/189  soft-tissue]
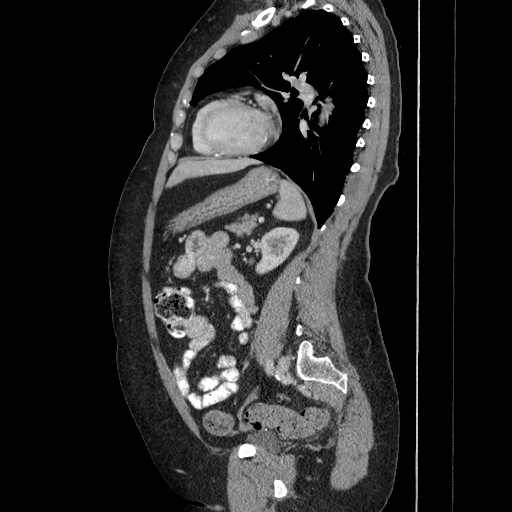
[im 126/189  soft-tissue]
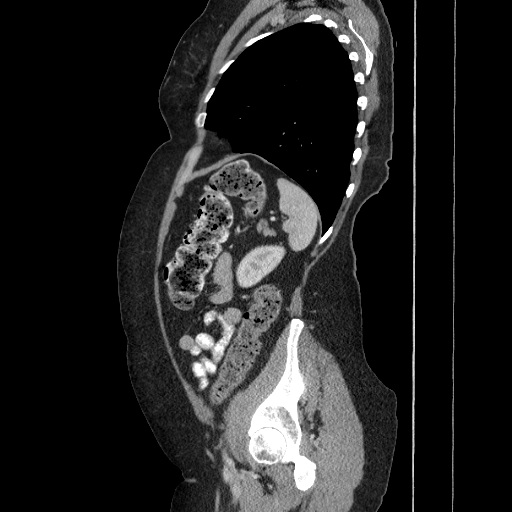
[im 142/189  soft-tissue]
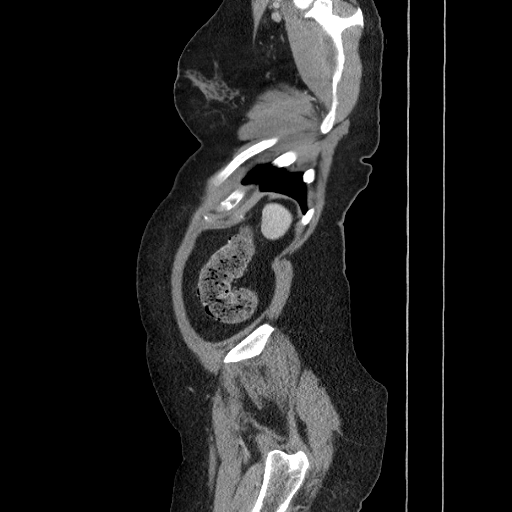
[im 157/189  soft-tissue]
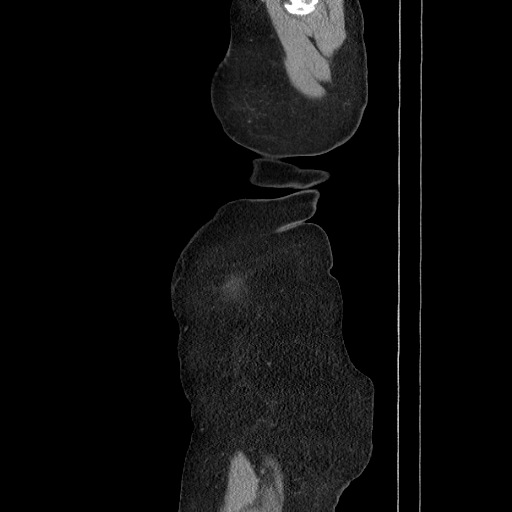
[im 157/189  bone]
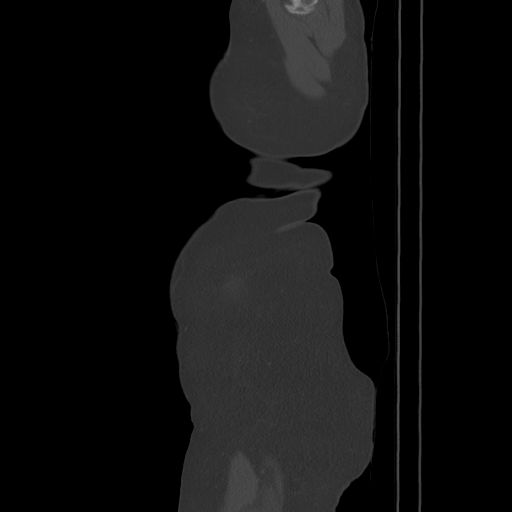
[im 173/189  soft-tissue]
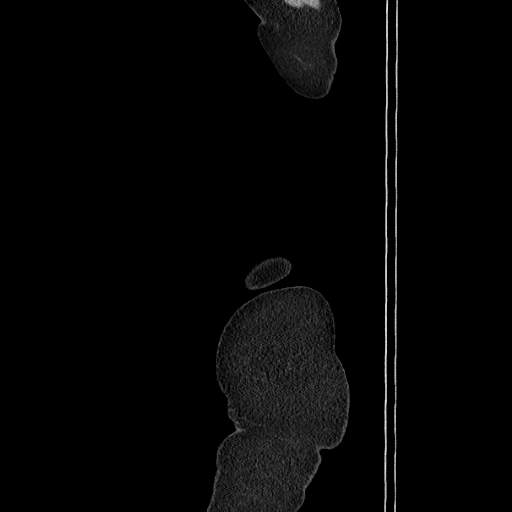

[11 of 36 positions shown; findings below may reference images not displayed]

FINDINGS: CT CHEST FINDINGS

Cardiovascular: Normal caliber thoracic aorta with mild calcific
atherosclerosis. Normal heart size. No pericardial effusion.

Mediastinum/Nodes: No enlarged mediastinal, hilar, or axillary lymph
nodes. Thyroid gland, trachea, and esophagus demonstrate no
significant findings.

Lungs/Pleura: Lungs are clear. No pleural effusion or pneumothorax.

Musculoskeletal: Multilevel moderate discogenic degenerative changes
of the thoracic spine with disc space narrowing, marginal
osteophytes, and endplate sclerosis. T7-8 calcified disc protrusion
mildly narrows the spinal canal.

CT ABDOMEN PELVIS FINDINGS

Hepatobiliary: Subcentimeter lucency in segment 8 of the liver is
likely a cyst. No other focal liver abnormality identified.
Gallstones. No secondary signs of acute cholecystitis. No intra or
extrahepatic biliary ductal dilatation.

Pancreas: Unremarkable. No pancreatic ductal dilatation or
surrounding inflammatory changes.

Spleen: Normal in size without focal abnormality.

Adrenals/Urinary Tract: Adrenal glands are unremarkable. Kidneys are
normal, without renal calculi, focal lesion, or hydronephrosis.
Bladder is unremarkable.

Stomach/Bowel: Stomach is within normal limits. Appendix appears
normal. No evidence of bowel wall thickening, distention, or
inflammatory changes. Small hiatal hernia.

Vascular/Lymphatic: Aortic atherosclerosis with mild calcification.
No enlarged abdominal or pelvic lymph nodes.

Reproductive: Status post hysterectomy. No adnexal masses.

Other: No abdominal wall hernia or abnormality. No abdominopelvic
ascites.

Musculoskeletal: Grade 1 L3-4 anterolisthesis. Lumbar degenerative
changes greatest at L1-2 where there is severe disc space narrowing
and endplate sclerosis. Moderate lower lumbar facet arthrosis. Disc
and facet disease moderately narrows the right-sided L3-4 neural
foramen.
IMPRESSION: 1. No acute process identified as explanation for pain.
2. Cholelithiasis.  No findings of acute cholecystitis.
3. Moderate multilevel discogenic degenerative changes of the
thoracic spine and lumbar spondylosis with severe L1-2 disc space
narrowing and lower lumbar facet arthrosis. No high-grade bony canal
stenosis.
4. Mild calcific aortic atherosclerosis.

By: Maricela Paquette M.D.

## 2018-07-05 DIAGNOSIS — I10 Essential (primary) hypertension: Secondary | ICD-10-CM | POA: Diagnosis not present

## 2018-07-05 DIAGNOSIS — M2550 Pain in unspecified joint: Secondary | ICD-10-CM | POA: Diagnosis not present

## 2018-07-05 DIAGNOSIS — G4700 Insomnia, unspecified: Secondary | ICD-10-CM | POA: Diagnosis not present

## 2018-07-05 DIAGNOSIS — I872 Venous insufficiency (chronic) (peripheral): Secondary | ICD-10-CM | POA: Diagnosis not present

## 2018-07-05 DIAGNOSIS — K580 Irritable bowel syndrome with diarrhea: Secondary | ICD-10-CM | POA: Diagnosis not present

## 2018-07-05 DIAGNOSIS — Z0000 Encounter for general adult medical examination without abnormal findings: Secondary | ICD-10-CM | POA: Diagnosis not present

## 2018-07-05 DIAGNOSIS — E7800 Pure hypercholesterolemia, unspecified: Secondary | ICD-10-CM | POA: Diagnosis not present

## 2018-07-05 DIAGNOSIS — F39 Unspecified mood [affective] disorder: Secondary | ICD-10-CM | POA: Diagnosis not present

## 2018-07-05 DIAGNOSIS — Z1159 Encounter for screening for other viral diseases: Secondary | ICD-10-CM | POA: Diagnosis not present

## 2018-07-08 DIAGNOSIS — Z1159 Encounter for screening for other viral diseases: Secondary | ICD-10-CM | POA: Diagnosis not present

## 2018-07-08 DIAGNOSIS — I10 Essential (primary) hypertension: Secondary | ICD-10-CM | POA: Diagnosis not present

## 2018-10-13 DIAGNOSIS — M2550 Pain in unspecified joint: Secondary | ICD-10-CM | POA: Diagnosis not present

## 2018-10-13 DIAGNOSIS — R768 Other specified abnormal immunological findings in serum: Secondary | ICD-10-CM | POA: Diagnosis not present

## 2018-10-13 DIAGNOSIS — M542 Cervicalgia: Secondary | ICD-10-CM | POA: Diagnosis not present

## 2018-10-13 DIAGNOSIS — Z6832 Body mass index (BMI) 32.0-32.9, adult: Secondary | ICD-10-CM | POA: Diagnosis not present

## 2018-10-13 DIAGNOSIS — E669 Obesity, unspecified: Secondary | ICD-10-CM | POA: Diagnosis not present

## 2018-10-13 DIAGNOSIS — M150 Primary generalized (osteo)arthritis: Secondary | ICD-10-CM | POA: Diagnosis not present

## 2018-10-13 DIAGNOSIS — M79671 Pain in right foot: Secondary | ICD-10-CM | POA: Diagnosis not present

## 2018-10-13 DIAGNOSIS — Z79899 Other long term (current) drug therapy: Secondary | ICD-10-CM | POA: Diagnosis not present

## 2018-11-17 DIAGNOSIS — G4700 Insomnia, unspecified: Secondary | ICD-10-CM | POA: Diagnosis not present

## 2018-11-17 DIAGNOSIS — R61 Generalized hyperhidrosis: Secondary | ICD-10-CM | POA: Diagnosis not present

## 2018-11-17 DIAGNOSIS — F39 Unspecified mood [affective] disorder: Secondary | ICD-10-CM | POA: Diagnosis not present

## 2018-12-15 ENCOUNTER — Other Ambulatory Visit: Payer: Self-pay

## 2018-12-15 DIAGNOSIS — Z20822 Contact with and (suspected) exposure to covid-19: Secondary | ICD-10-CM

## 2018-12-16 LAB — NOVEL CORONAVIRUS, NAA: SARS-CoV-2, NAA: NOT DETECTED

## 2019-03-29 ENCOUNTER — Ambulatory Visit: Payer: Self-pay

## 2019-04-07 ENCOUNTER — Ambulatory Visit: Payer: Medicare Other | Attending: Internal Medicine

## 2019-04-07 DIAGNOSIS — Z23 Encounter for immunization: Secondary | ICD-10-CM | POA: Insufficient documentation

## 2019-04-07 NOTE — Progress Notes (Signed)
   Covid-19 Vaccination Clinic  Name:  Debra Mcbride    MRN: [id number] DOB: Mar 13, 1950  04/07/2019  [name] was observed post Covid-19 immunization for 15 minutes without incidence. She was provided with Vaccine Information Sheet and instruction to access the V-Safe system.   [name] was instructed to call 911 with any severe reactions post vaccine: Marland Kitchen Difficulty breathing  . Swelling of your face and throat  . A fast heartbeat  . A bad rash all over your body  . Dizziness and weakness    Immunizations Administered    Name Date Dose VIS Date Route   Pfizer COVID-19 Vaccine 04/07/2019 10:37 AM 0.3 mL 02/11/2019 Intramuscular   Manufacturer: [place]   Lot: [id number]   [place]: [id number]

## 2019-04-19 ENCOUNTER — Ambulatory Visit: Payer: Self-pay

## 2019-05-02 ENCOUNTER — Ambulatory Visit: Payer: Medicare Other | Attending: Internal Medicine

## 2019-05-02 DIAGNOSIS — Z23 Encounter for immunization: Secondary | ICD-10-CM | POA: Insufficient documentation

## 2019-05-02 NOTE — Progress Notes (Signed)
   Covid-19 Vaccination Clinic  Name:  Debra Mcbride    MRN: [id number] DOB: [birth year]  05/02/2019  [name] was observed post Covid-19 immunization for 15 minutes without incidence. She was provided with Vaccine Information Sheet and instruction to access the V-Safe system.   [name] was instructed to call 911 with any severe reactions post vaccine: Marland Kitchen Difficulty breathing  . Swelling of your face and throat  . A fast heartbeat  . A bad rash all over your body  . Dizziness and weakness    Immunizations Administered    Name Date Dose VIS Date Route   Pfizer COVID-19 Vaccine 05/02/2019  2:30 PM 0.3 mL 02/11/2019 Intramuscular   Manufacturer: [place]   Lot: [id number]   [place]: [id number]

## 2019-05-03 DIAGNOSIS — R768 Other specified abnormal immunological findings in serum: Secondary | ICD-10-CM | POA: Diagnosis not present

## 2019-05-03 DIAGNOSIS — M542 Cervicalgia: Secondary | ICD-10-CM | POA: Diagnosis not present

## 2019-05-03 DIAGNOSIS — E669 Obesity, unspecified: Secondary | ICD-10-CM | POA: Diagnosis not present

## 2019-05-03 DIAGNOSIS — M2550 Pain in unspecified joint: Secondary | ICD-10-CM | POA: Diagnosis not present

## 2019-05-03 DIAGNOSIS — M150 Primary generalized (osteo)arthritis: Secondary | ICD-10-CM | POA: Diagnosis not present

## 2019-05-03 DIAGNOSIS — M79671 Pain in right foot: Secondary | ICD-10-CM | POA: Diagnosis not present

## 2019-06-06 DIAGNOSIS — E7800 Pure hypercholesterolemia, unspecified: Secondary | ICD-10-CM | POA: Diagnosis not present

## 2019-06-06 DIAGNOSIS — R7301 Impaired fasting glucose: Secondary | ICD-10-CM | POA: Diagnosis not present

## 2019-06-23 DIAGNOSIS — F39 Unspecified mood [affective] disorder: Secondary | ICD-10-CM | POA: Diagnosis not present

## 2019-06-23 DIAGNOSIS — I8000 Phlebitis and thrombophlebitis of superficial vessels of unspecified lower extremity: Secondary | ICD-10-CM | POA: Diagnosis not present

## 2019-07-06 DIAGNOSIS — E7800 Pure hypercholesterolemia, unspecified: Secondary | ICD-10-CM | POA: Diagnosis not present

## 2019-07-06 DIAGNOSIS — Z1389 Encounter for screening for other disorder: Secondary | ICD-10-CM | POA: Diagnosis not present

## 2019-07-06 DIAGNOSIS — K580 Irritable bowel syndrome with diarrhea: Secondary | ICD-10-CM | POA: Diagnosis not present

## 2019-07-06 DIAGNOSIS — Z0000 Encounter for general adult medical examination without abnormal findings: Secondary | ICD-10-CM | POA: Diagnosis not present

## 2019-07-06 DIAGNOSIS — G4700 Insomnia, unspecified: Secondary | ICD-10-CM | POA: Diagnosis not present

## 2019-07-06 DIAGNOSIS — I10 Essential (primary) hypertension: Secondary | ICD-10-CM | POA: Diagnosis not present

## 2019-07-06 DIAGNOSIS — I872 Venous insufficiency (chronic) (peripheral): Secondary | ICD-10-CM | POA: Diagnosis not present

## 2019-07-06 DIAGNOSIS — M2550 Pain in unspecified joint: Secondary | ICD-10-CM | POA: Diagnosis not present

## 2019-07-06 DIAGNOSIS — Z23 Encounter for immunization: Secondary | ICD-10-CM | POA: Diagnosis not present

## 2019-07-06 DIAGNOSIS — F39 Unspecified mood [affective] disorder: Secondary | ICD-10-CM | POA: Diagnosis not present

## 2019-07-20 ENCOUNTER — Other Ambulatory Visit: Payer: Self-pay

## 2019-07-20 ENCOUNTER — Other Ambulatory Visit (HOSPITAL_COMMUNITY): Payer: Self-pay | Admitting: Family Medicine

## 2019-07-20 ENCOUNTER — Ambulatory Visit (HOSPITAL_COMMUNITY)
Admission: RE | Admit: 2019-07-20 | Discharge: 2019-07-20 | Disposition: A | Discharge status: Home / Self Care | Payer: Medicare Other | Source: Ambulatory Visit | Attending: Surgery | Admitting: Surgery

## 2019-07-20 DIAGNOSIS — M79605 Pain in left leg: Secondary | ICD-10-CM | POA: Insufficient documentation

## 2019-07-26 DIAGNOSIS — Z780 Asymptomatic menopausal state: Secondary | ICD-10-CM | POA: Diagnosis not present

## 2019-07-26 DIAGNOSIS — Z1231 Encounter for screening mammogram for malignant neoplasm of breast: Secondary | ICD-10-CM | POA: Diagnosis not present

## 2019-08-11 ENCOUNTER — Emergency Department (HOSPITAL_BASED_OUTPATIENT_CLINIC_OR_DEPARTMENT_OTHER): Payer: Medicare Other

## 2019-08-11 ENCOUNTER — Other Ambulatory Visit: Payer: Self-pay

## 2019-08-11 ENCOUNTER — Emergency Department (HOSPITAL_BASED_OUTPATIENT_CLINIC_OR_DEPARTMENT_OTHER)
Admission: EM | Admit: 2019-08-11 | Discharge: 2019-08-11 | Disposition: A | Discharge status: Home / Self Care | Payer: Medicare Other | Attending: Emergency Medicine | Admitting: Emergency Medicine

## 2019-08-11 ENCOUNTER — Encounter (HOSPITAL_BASED_OUTPATIENT_CLINIC_OR_DEPARTMENT_OTHER): Payer: Self-pay | Admitting: Emergency Medicine

## 2019-08-11 DIAGNOSIS — I10 Essential (primary) hypertension: Secondary | ICD-10-CM | POA: Insufficient documentation

## 2019-08-11 DIAGNOSIS — M1712 Unilateral primary osteoarthritis, left knee: Secondary | ICD-10-CM | POA: Diagnosis not present

## 2019-08-11 DIAGNOSIS — M25562 Pain in left knee: Secondary | ICD-10-CM | POA: Diagnosis not present

## 2019-08-11 DIAGNOSIS — Z96651 Presence of right artificial knee joint: Secondary | ICD-10-CM | POA: Diagnosis not present

## 2019-08-11 HISTORY — DX: Pure hypercholesterolemia, unspecified: E78.00

## 2019-08-11 HISTORY — DX: Essential (primary) hypertension: I10

## 2019-08-11 NOTE — ED Provider Notes (Signed)
[facility] EMERGENCY DEPARTMENT Provider Note   CSN: [id number] Arrival date & time: 08/11/19  1422     History Chief Complaint  Patient presents with  . Knee Pain    Debra Mcbride is a 69 y.o. female.  69 y.o female with a PMH of HTN, High cholesterol, prior right knee replacement presents to the ED with a chief complaint of left knee pain x 1 month. Describes the pain along the lateral and posterior aspect, exacerbated with ambulation and worse at the end of the day. Has tried meloxicam, ice along with hydrocodone without improvement in symptoms. Reports she was evaluated by PCP, who evaluated her leg with an [place], negative for DVT. She has an appt with orthopedics on Tuesday but reports pain was worsening. No trauma, fever, or other complaints.   The history is provided by the patient.       Past Medical History:  Diagnosis Date  . High cholesterol   . Hypertension     There are no problems to display for this patient.   Past Surgical History:  Procedure Laterality Date  . REPLACEMENT TOTAL KNEE Right      OB History   No obstetric history on file.     No family history on file.  Social History   Tobacco Use  . Smoking status: Never Smoker  . Smokeless tobacco: Never Used  Substance Use Topics  . Alcohol use: Not Currently  . Drug use: Not on file    Home Medications Prior to Admission medications   Medication Sig Start Date End Date Taking? Authorizing Provider  diltiazem (CARDIZEM) 120 MG tablet diltiazem 120 mg tablet 01/29/15  Yes [provider]  colesevelam (WELCHOL) 625 MG tablet  08/08/19   [provider]  meloxicam (MOBIC) 15 MG tablet Take 15 mg by mouth daily. 07/31/19   [provider]  venlafaxine (EFFEXOR-XR) 75 MG 24 hr capsule Take 75 mg by mouth.      [provider]    Allergies    Sulfa antibiotics  Review of Systems   Review of Systems  Constitutional: Negative for fever.    Musculoskeletal: Positive for arthralgias.    Physical Exam Updated Vital Signs BP 131/77 (BP Location: Left Arm)   Pulse 75   Temp 98.5 F (36.9 C) (Oral)   Resp 17   Ht 5\' 2"  (1.575 m)   Wt 79.8 kg   SpO2 96%   BMI 32.19 kg/m   Physical Exam Vitals and nursing note reviewed.  Constitutional:      Appearance: Normal appearance.  HENT:     Head: Normocephalic and atraumatic.     Nose: Nose normal.  Cardiovascular:     Rate and Rhythm: Normal rate.  Pulmonary:     Effort: Pulmonary effort is normal.  Abdominal:     General: Abdomen is flat.  Musculoskeletal:        General: Tenderness present.     Cervical back: Normal range of motion and neck supple.     Left knee: Bony tenderness present. No swelling, deformity, effusion, erythema or lacerations. Normal range of motion. Tenderness present over the lateral joint line.     Right lower leg: No edema.     Left lower leg: No edema.     Comments: Pulses present, limited ROM with flexion due to pain. No erythema, or edema noted.   Skin:    General: Skin is warm and dry.  Neurological:     Mental  Status: She is alert and oriented to person, place, and time.     ED Results / Procedures / Treatments   Labs (all labs ordered are listed, but only abnormal results are displayed) Labs Reviewed - No data to display  EKG None  Radiology DG Knee 2 Views Left  Result Date: 08/11/2019 CLINICAL DATA:  69 year old female with left knee pain and swelling, chronic but increased. EXAM: LEFT KNEE - 1-2 VIEW COMPARISON:  Bilateral knee AP views 03/27/2016. FINDINGS: Bone mineralization is within normal limits. Possible small joint effusion. Tricompartmental degenerative spurring with joint space loss in the patellofemoral compartment. No acute osseous abnormality identified. IMPRESSION: Tricompartmental degenerative changes worst in the patellofemoral compartment. Possible small joint effusion. Electronically Signed   By: Genevie Ann M.D.    On: 08/11/2019 15:43    Procedures Procedures (including critical care time)  Medications Ordered in ED Medications - No data to display  ED Course  I have reviewed the triage vital signs and the nursing notes.  Pertinent labs & imaging results that were available during my care of the patient were reviewed by me and considered in my medical decision making (see chart for details).    MDM Rules/Calculators/A&P   Patient with a past medical history of right knee replacement presents to the ED with complaints of left knee pain ongoing for the past month.  Evaluated by PCP, had a negative DVT study.  States he has been taking meloxicam, ice, hydrocodone without improvement in her symptoms.  Does have a orthopedic appointment set up for Tuesday but states the pain had worsened.  On evaluation patient is overall well-appearing, vital signs are within normal limits, she is afebrile, no erythema, edema, swelling noted to the left knee.  Does not appear to have any changes in the skin, no streaking, lower suspicion for cellulitis.  Is able to move her left knee with some pain, I have a low suspicion for septic joint at this time with stable vital signs.  This pain has been ongoing, she does have a previous history of replacement to her right knee.  X-ray showed:  Tricompartmental degenerative changes worst in the patellofemoral  compartment. Possible small joint effusion.   These results were discussed at length with patient, seems that she will likely need intervention to her left knee.  She does have an established appointment on Tuesday, advised to continue symptomatic treatment at home.  Patient understands and agrees with management, provided with a copy of her x-ray.  Patient is stable for discharge at this time.    Portions of this note were generated with [employer]. Dictation errors may occur despite best attempts at proofreading.  Final Clinical Impression(s) / ED  Diagnoses Final diagnoses:  Acute pain of left knee    Rx / DC Orders ED Discharge Orders    None       Janeece Fitting, PA-C 08/11/19 1610    Long, Wonda [facility], MD 08/11/19 1644

## 2019-08-11 NOTE — ED Notes (Signed)
No swelling in lower left leg or the left foot.  Pedal pulse is positive and bounding in the L foot.

## 2019-08-11 NOTE — ED Triage Notes (Signed)
L knee pain x 1 month. Has seen her PCP with neg [place]. Has an appt with ortho next week but pain has worsened.

## 2019-08-11 NOTE — ED Notes (Signed)
Pt. Reports she had an [place] about a month ago on the L knee due to pain and it was negative for Blood Clot but the pain continues and has gotten worse and nonstop.

## 2019-08-11 NOTE — Discharge Instructions (Signed)
I have provided a copy of your x-ray in order for you to take this to your orthopedic appointment on Tuesday.  You may continue pain control with meloxicam, ice or heat to the area along with elevation.

## 2019-08-16 DIAGNOSIS — Z96651 Presence of right artificial knee joint: Secondary | ICD-10-CM | POA: Diagnosis not present

## 2019-08-16 DIAGNOSIS — M1712 Unilateral primary osteoarthritis, left knee: Secondary | ICD-10-CM | POA: Diagnosis not present

## 2019-08-16 DIAGNOSIS — M21062 Valgus deformity, not elsewhere classified, left knee: Secondary | ICD-10-CM | POA: Diagnosis not present

## 2019-10-06 DIAGNOSIS — M7061 Trochanteric bursitis, right hip: Secondary | ICD-10-CM | POA: Diagnosis not present

## 2019-11-08 DIAGNOSIS — M2550 Pain in unspecified joint: Secondary | ICD-10-CM | POA: Diagnosis not present

## 2019-11-08 DIAGNOSIS — E669 Obesity, unspecified: Secondary | ICD-10-CM | POA: Diagnosis not present

## 2019-11-08 DIAGNOSIS — M150 Primary generalized (osteo)arthritis: Secondary | ICD-10-CM | POA: Diagnosis not present

## 2019-11-08 DIAGNOSIS — M542 Cervicalgia: Secondary | ICD-10-CM | POA: Diagnosis not present

## 2019-11-08 DIAGNOSIS — M79671 Pain in right foot: Secondary | ICD-10-CM | POA: Diagnosis not present

## 2019-11-08 DIAGNOSIS — R768 Other specified abnormal immunological findings in serum: Secondary | ICD-10-CM | POA: Diagnosis not present

## 2019-11-08 DIAGNOSIS — Z6830 Body mass index (BMI) 30.0-30.9, adult: Secondary | ICD-10-CM | POA: Diagnosis not present

## 2019-11-09 DIAGNOSIS — F329 Major depressive disorder, single episode, unspecified: Secondary | ICD-10-CM | POA: Diagnosis not present

## 2019-11-09 DIAGNOSIS — F39 Unspecified mood [affective] disorder: Secondary | ICD-10-CM | POA: Diagnosis not present

## 2019-11-09 DIAGNOSIS — R5383 Other fatigue: Secondary | ICD-10-CM | POA: Diagnosis not present

## 2019-11-15 ENCOUNTER — Other Ambulatory Visit: Payer: Self-pay

## 2019-11-15 ENCOUNTER — Telehealth: Payer: Self-pay | Admitting: Physician Assistant

## 2019-11-15 ENCOUNTER — Encounter: Payer: Self-pay | Admitting: Physician Assistant

## 2019-11-15 ENCOUNTER — Ambulatory Visit (INDEPENDENT_AMBULATORY_CARE_PROVIDER_SITE_OTHER): Payer: Medicare Other | Admitting: Physician Assistant

## 2019-11-15 DIAGNOSIS — D229 Melanocytic nevi, unspecified: Secondary | ICD-10-CM | POA: Diagnosis not present

## 2019-11-15 DIAGNOSIS — Z86018 Personal history of other benign neoplasm: Secondary | ICD-10-CM | POA: Diagnosis not present

## 2019-11-15 DIAGNOSIS — D1800 Hemangioma unspecified site: Secondary | ICD-10-CM

## 2019-11-15 DIAGNOSIS — L821 Other seborrheic keratosis: Secondary | ICD-10-CM | POA: Diagnosis not present

## 2019-11-15 DIAGNOSIS — L578 Other skin changes due to chronic exposure to nonionizing radiation: Secondary | ICD-10-CM | POA: Diagnosis not present

## 2019-11-15 DIAGNOSIS — D485 Neoplasm of uncertain behavior of skin: Secondary | ICD-10-CM

## 2019-11-15 DIAGNOSIS — L570 Actinic keratosis: Secondary | ICD-10-CM

## 2019-11-15 DIAGNOSIS — D2121 Benign neoplasm of connective and other soft tissue of right lower limb, including hip: Secondary | ICD-10-CM | POA: Diagnosis not present

## 2019-11-15 DIAGNOSIS — Z1283 Encounter for screening for malignant neoplasm of skin: Secondary | ICD-10-CM | POA: Diagnosis not present

## 2019-11-15 DIAGNOSIS — L814 Other melanin hyperpigmentation: Secondary | ICD-10-CM

## 2019-11-15 NOTE — Telephone Encounter (Signed)
Debra Mcbride recommended advanced laser.

## 2019-11-15 NOTE — Patient Instructions (Signed)

## 2019-11-15 NOTE — Progress Notes (Addendum)
   Follow-Up Visit   Subjective  Debra Mcbride is a 69 y.o. female who presents for the following: Annual Exam (right side of breast- x months- "crusty").   The following portions of the chart were reviewed this encounter and updated as appropriate: Tobacco  Allergies  Meds  Problems  Med Hx  Surg Hx  Fam Hx      Objective  Well appearing patient in no apparent distress; mood and affect are within normal limits.  A full examination was performed including scalp, head, eyes, ears, nose, lips, neck, chest, axillae, abdomen, back, buttocks, bilateral upper extremities, bilateral lower extremities, hands, feet, fingers, toes, fingernails, and toenails. All findings within normal limits unless otherwise noted below.  Objective  Mid Back: Scar clear  Objective  Head to toe: No atypical nevi No signs of non-mole skin cancer.   Objective  Right Thigh - Anterior: Floppy papule Cautery only     Objective  jawline (4): Erythematous patches with gritty scale.  Assessment & Plan  History of dysplastic nevus Mid Back  observe  Screening exam for skin cancer Head to toe  Skin exams  Neoplasm of uncertain behavior of skin Right Thigh - Anterior  Epidermal / dermal shaving  Lesion diameter (cm):  1 Informed consent: discussed and consent obtained   Timeout: patient name, date of birth, surgical site, and procedure verified   Procedure prep:  Patient was prepped and draped in usual sterile fashion Prep type:  Chlorhexidine Anesthesia: the lesion was anesthetized in a standard fashion   Anesthetic:  1% lidocaine w/ epinephrine 1-100,000 local infiltration Instrument used: DermaBlade   Hemostasis achieved with: aluminum chloride   Outcome: patient tolerated procedure well   Post-procedure details: sterile dressing applied and wound care instructions given   Dressing type: petrolatum gauze, petrolatum and bandage    Specimen 1 - Surgical pathology Differential  Diagnosis: r/o DN Check Margins: No  AK (actinic keratosis) (4) jawline  Destruction of lesion - jawline Complexity: simple   Destruction method: cryotherapy   Informed consent: discussed and consent obtained   Timeout:  patient name, date of birth, surgical site, and procedure verified Lesion destroyed using liquid nitrogen: Yes   Cryotherapy cycles:  1 Outcome: patient tolerated procedure well with no complications   Post-procedure details: wound care instructions given    Lentigines - Scattered tan macules - Discussed due to sun exposure - Benign, observe - Call for any changes  Seborrheic Keratoses - Stuck-on, waxy, tan-brown papules and plaques  - Discussed benign etiology and prognosis. - Observe - Call for any changes  Melanocytic Nevi - Tan-brown and/or pink-flesh-colored symmetric macules and papules - Benign appearing on exam today - Observation - Call clinic for new or changing moles - Recommend daily use of broad spectrum spf 30+ sunscreen to sun-exposed areas.   Hemangiomas - Red papules - Discussed benign nature - Observe - Call for any changes  Actinic Damage - diffuse scaly erythematous macules with underlying dyspigmentation - Recommend daily broad spectrum sunscreen SPF 30+ to sun-exposed areas, reapply every 2 hours as needed.  - Call for new or changing lesions.  Skin cancer screening performed today.   I, Lieutenant Abarca, PA-C, have reviewed all documentation's for this visit.  The documentation on 11/23/19 for the exam, diagnosis, procedures and orders are all accurate and complete.

## 2019-11-15 NOTE — Telephone Encounter (Signed)
Patient calling back for name of place she needs to call for laser treatment of face.

## 2019-11-22 DIAGNOSIS — M1712 Unilateral primary osteoarthritis, left knee: Secondary | ICD-10-CM | POA: Diagnosis not present

## 2020-01-06 DIAGNOSIS — Z23 Encounter for immunization: Secondary | ICD-10-CM | POA: Diagnosis not present

## 2020-01-06 DIAGNOSIS — E7800 Pure hypercholesterolemia, unspecified: Secondary | ICD-10-CM | POA: Diagnosis not present

## 2020-01-06 DIAGNOSIS — M25462 Effusion, left knee: Secondary | ICD-10-CM | POA: Diagnosis not present

## 2020-01-06 DIAGNOSIS — I10 Essential (primary) hypertension: Secondary | ICD-10-CM | POA: Diagnosis not present

## 2020-01-06 DIAGNOSIS — F39 Unspecified mood [affective] disorder: Secondary | ICD-10-CM | POA: Diagnosis not present

## 2020-01-24 DIAGNOSIS — Z23 Encounter for immunization: Secondary | ICD-10-CM | POA: Diagnosis not present

## 2020-02-11 ENCOUNTER — Inpatient Hospital Stay (HOSPITAL_COMMUNITY)
Admission: EM | Admit: 2020-02-11 | Discharge: 2020-02-14 | DRG: 419 | Disposition: A | Discharge status: Home / Self Care | Payer: Medicare Other | Attending: General Surgery | Admitting: General Surgery

## 2020-02-11 ENCOUNTER — Emergency Department (HOSPITAL_COMMUNITY): Payer: Medicare Other

## 2020-02-11 ENCOUNTER — Other Ambulatory Visit: Payer: Self-pay

## 2020-02-11 ENCOUNTER — Encounter (HOSPITAL_COMMUNITY): Payer: Self-pay

## 2020-02-11 DIAGNOSIS — R1013 Epigastric pain: Secondary | ICD-10-CM | POA: Diagnosis not present

## 2020-02-11 DIAGNOSIS — K82A1 Gangrene of gallbladder in cholecystitis: Secondary | ICD-10-CM | POA: Diagnosis present

## 2020-02-11 DIAGNOSIS — I10 Essential (primary) hypertension: Secondary | ICD-10-CM | POA: Diagnosis present

## 2020-02-11 DIAGNOSIS — E785 Hyperlipidemia, unspecified: Secondary | ICD-10-CM | POA: Diagnosis present

## 2020-02-11 DIAGNOSIS — I700 Atherosclerosis of aorta: Secondary | ICD-10-CM | POA: Diagnosis present

## 2020-02-11 DIAGNOSIS — R1011 Right upper quadrant pain: Secondary | ICD-10-CM | POA: Diagnosis not present

## 2020-02-11 DIAGNOSIS — K810 Acute cholecystitis: Secondary | ICD-10-CM | POA: Diagnosis present

## 2020-02-11 DIAGNOSIS — K8020 Calculus of gallbladder without cholecystitis without obstruction: Secondary | ICD-10-CM | POA: Diagnosis not present

## 2020-02-11 DIAGNOSIS — K760 Fatty (change of) liver, not elsewhere classified: Secondary | ICD-10-CM | POA: Diagnosis present

## 2020-02-11 DIAGNOSIS — Z882 Allergy status to sulfonamides status: Secondary | ICD-10-CM

## 2020-02-11 DIAGNOSIS — Z20822 Contact with and (suspected) exposure to covid-19: Secondary | ICD-10-CM | POA: Diagnosis not present

## 2020-02-11 DIAGNOSIS — E7800 Pure hypercholesterolemia, unspecified: Secondary | ICD-10-CM | POA: Diagnosis present

## 2020-02-11 DIAGNOSIS — Z79899 Other long term (current) drug therapy: Secondary | ICD-10-CM

## 2020-02-11 DIAGNOSIS — K8066 Calculus of gallbladder and bile duct with acute and chronic cholecystitis without obstruction: Secondary | ICD-10-CM | POA: Diagnosis not present

## 2020-02-11 DIAGNOSIS — K8000 Calculus of gallbladder with acute cholecystitis without obstruction: Principal | ICD-10-CM | POA: Diagnosis present

## 2020-02-11 DIAGNOSIS — Z96651 Presence of right artificial knee joint: Secondary | ICD-10-CM | POA: Diagnosis present

## 2020-02-11 HISTORY — DX: Other specified postprocedural states: Z98.890

## 2020-02-11 HISTORY — DX: Nausea with vomiting, unspecified: R11.2

## 2020-02-11 HISTORY — DX: Other complications of anesthesia, initial encounter: T88.59XA

## 2020-02-11 LAB — CBC
HCT: 43.2 % (ref 36.0–46.0)
HCT: 42.2 % (ref 36.0–46.0)
Hemoglobin: 13.9 g/dL (ref 12.0–15.0)
Hemoglobin: 14.3 g/dL (ref 12.0–15.0)
MCH: 30.3 pg (ref 26.0–34.0)
MCH: 31 pg (ref 26.0–34.0)
MCHC: 32.2 g/dL (ref 30.0–36.0)
MCHC: 33.9 g/dL (ref 30.0–36.0)
MCV: 94.3 fL (ref 80.0–100.0)
MCV: 91.3 fL (ref 80.0–100.0)
Platelets: 271 10*3/uL (ref 150–400)
Platelets: 237 10*3/uL (ref 150–400)
RBC: 4.58 MIL/uL (ref 3.87–5.11)
RBC: 4.62 MIL/uL (ref 3.87–5.11)
RDW: 13 % (ref 11.5–15.5)
RDW: 13.1 % (ref 11.5–15.5)
WBC: 12.9 10*3/uL — ABNORMAL HIGH (ref 4.0–10.5)
WBC: 19.2 10*3/uL — ABNORMAL HIGH (ref 4.0–10.5)
nRBC: 0 % (ref 0.0–0.2)
nRBC: 0 % (ref 0.0–0.2)

## 2020-02-11 LAB — HEPATIC FUNCTION PANEL
ALT: 23 U/L (ref 0–44)
AST: 26 U/L (ref 15–41)
Albumin: 4.1 g/dL (ref 3.5–5.0)
Alkaline Phosphatase: 70 U/L (ref 38–126)
Bilirubin, Direct: 0.3 mg/dL — ABNORMAL HIGH (ref 0.0–0.2)
Indirect Bilirubin: 0.7 mg/dL (ref 0.3–0.9)
Total Bilirubin: 1 mg/dL (ref 0.3–1.2)
Total Protein: 6.8 g/dL (ref 6.5–8.1)

## 2020-02-11 LAB — SURGICAL PCR SCREEN
MRSA, PCR: NEGATIVE
Staphylococcus aureus: POSITIVE — AB

## 2020-02-11 LAB — BASIC METABOLIC PANEL
Anion gap: 12 (ref 5–15)
BUN: 10 mg/dL (ref 8–23)
CO2: 23 mmol/L (ref 22–32)
Calcium: 9.6 mg/dL (ref 8.9–10.3)
Chloride: 105 mmol/L (ref 98–111)
Creatinine, Ser: 0.74 mg/dL (ref 0.44–1.00)
GFR, Estimated: >60 mL/min (ref >60)
Glucose, Bld: 149 mg/dL — ABNORMAL HIGH (ref 70–99)
Potassium: 3.7 mmol/L (ref 3.5–5.1)
Sodium: 140 mmol/L (ref 135–145)

## 2020-02-11 LAB — CREATININE, SERUM
Creatinine, Ser: 0.67 mg/dL (ref 0.44–1.00)
GFR, Estimated: >60 mL/min (ref >60)

## 2020-02-11 LAB — TROPONIN I (HIGH SENSITIVITY)
Troponin I (High Sensitivity): 3 ng/L (ref <18)
Troponin I (High Sensitivity): 5 ng/L (ref <18)

## 2020-02-11 LAB — RESP PANEL BY RT-PCR (FLU A&B, COVID) ARPGX2
Influenza A by PCR: NEGATIVE
Influenza B by PCR: NEGATIVE
SARS Coronavirus 2 by RT PCR: NEGATIVE

## 2020-02-11 LAB — LIPASE, BLOOD: Lipase: 24 U/L (ref 11–51)

## 2020-02-11 MED ORDER — SODIUM CHLORIDE 0.9 % IV SOLN
2.0000 g | Freq: Once | INTRAVENOUS | Status: AC
  Administered 2020-02-11: 2 g via INTRAVENOUS
  Filled 2020-02-11: qty 20

## 2020-02-11 MED ORDER — OXYCODONE HCL 5 MG PO TABS
5.0000 mg | ORAL_TABLET | ORAL | Status: DC | PRN
  Administered 2020-02-12: 10 mg via ORAL
  Administered 2020-02-12: 5 mg via ORAL
  Filled 2020-02-11: qty 1
  Filled 2020-02-11: qty 2

## 2020-02-11 MED ORDER — ENOXAPARIN SODIUM 40 MG/0.4ML ~~LOC~~ SOLN
40.0000 mg | SUBCUTANEOUS | Status: DC
  Administered 2020-02-11 – 2020-02-13 (×3): 40 mg via SUBCUTANEOUS
  Filled 2020-02-11 (×3): qty 0.4

## 2020-02-11 MED ORDER — HYDROMORPHONE HCL 1 MG/ML IJ SOLN
0.5000 mg | INTRAMUSCULAR | Status: DC | PRN
  Administered 2020-02-11 – 2020-02-12 (×4): 0.5 mg via INTRAVENOUS
  Filled 2020-02-11 (×4): qty 1

## 2020-02-11 MED ORDER — MUPIROCIN 2 % EX OINT
TOPICAL_OINTMENT | CUTANEOUS | Status: AC
  Filled 2020-02-11: qty 22

## 2020-02-11 MED ORDER — MUPIROCIN 2 % EX OINT
1.0000 "application " | TOPICAL_OINTMENT | Freq: Two times a day (BID) | CUTANEOUS | Status: DC
  Administered 2020-02-11 – 2020-02-14 (×5): 1 via NASAL
  Filled 2020-02-11 (×2): qty 22

## 2020-02-11 MED ORDER — SODIUM CHLORIDE 0.9 % IV SOLN
2.0000 g | INTRAVENOUS | Status: DC
  Filled 2020-02-11: qty 20

## 2020-02-11 MED ORDER — MORPHINE SULFATE (PF) 4 MG/ML IV SOLN
4.0000 mg | Freq: Once | INTRAVENOUS | Status: AC
Start: 2020-02-11 — End: 2020-02-11
  Administered 2020-02-11: 4 mg via INTRAVENOUS
  Filled 2020-02-11: qty 1

## 2020-02-11 MED ORDER — ONDANSETRON 4 MG PO TBDP: 4.0000 mg | ORAL_TABLET | Freq: Four times a day (QID) | ORAL | Status: DC | PRN

## 2020-02-11 MED ORDER — ONDANSETRON HCL 4 MG/2ML IJ SOLN
4.0000 mg | Freq: Four times a day (QID) | INTRAMUSCULAR | Status: DC | PRN
  Administered 2020-02-12: 4 mg via INTRAVENOUS
  Filled 2020-02-11: qty 2

## 2020-02-11 MED ORDER — ONDANSETRON HCL 4 MG/2ML IJ SOLN
4.0000 mg | Freq: Once | INTRAMUSCULAR | Status: AC
  Administered 2020-02-11: 4 mg via INTRAVENOUS
  Filled 2020-02-11: qty 2

## 2020-02-11 MED ORDER — IOHEXOL 300 MG/ML  SOLN
100.0000 mL | Freq: Once | INTRAMUSCULAR | Status: AC | PRN
  Administered 2020-02-11: 100 mL via INTRAVENOUS

## 2020-02-11 MED ORDER — LACTATED RINGERS IV BOLUS
1000.0000 mL | Freq: Once | INTRAVENOUS | Status: AC
  Administered 2020-02-11: 1000 mL via INTRAVENOUS

## 2020-02-11 MED ORDER — MORPHINE SULFATE (PF) 4 MG/ML IV SOLN
4.0000 mg | Freq: Once | INTRAVENOUS | Status: AC
  Administered 2020-02-11: 4 mg via INTRAVENOUS
  Filled 2020-02-11: qty 1

## 2020-02-11 MED ORDER — LACTATED RINGERS IV SOLN: INTRAVENOUS | Status: DC

## 2020-02-11 MED ORDER — ACETAMINOPHEN 500 MG PO TABS
1000.0000 mg | ORAL_TABLET | Freq: Four times a day (QID) | ORAL | Status: DC
  Administered 2020-02-11 – 2020-02-14 (×9): 1000 mg via ORAL
  Filled 2020-02-11 (×10): qty 2

## 2020-02-11 MED ORDER — CHLORHEXIDINE GLUCONATE CLOTH 2 % EX PADS
6.0000 | MEDICATED_PAD | Freq: Every day | CUTANEOUS | Status: DC
  Administered 2020-02-13: 6 via TOPICAL

## 2020-02-11 NOTE — ED Notes (Signed)
covid swab sent

## 2020-02-11 NOTE — ED Triage Notes (Signed)
Patient reports that she was awakened during the night with epigastric pain and vomiting. States that the pain has now radiated to right lower abdomen. Patient alert and oriented, NAD

## 2020-02-11 NOTE — ED Notes (Signed)
Pt requesting meds for pain. Kim RN made aware.

## 2020-02-11 NOTE — ED Notes (Signed)
Patient transported to CT 

## 2020-02-11 NOTE — H&P (Signed)
Admitting Physician: Nickola Major Julieth Tugman  Service: General surgery   CC: Abdominal pain  Subjective   HPI: Debra Mcbride is an 69 y.o. female who is here for abdominal pain.  Her pain started this morning and became progressively worse throughout the day.  Is very severe now and only was relieved when she had pain medication.  Is looking in the right upper quadrant and epigastric area.  Past Medical History:  Diagnosis Date  . High cholesterol   . Hypertension     Past Surgical History:  Procedure Laterality Date  . REPLACEMENT TOTAL KNEE Right    No family history of problems with anesthesia, blood clots, bleeding disorders.  Social:  reports that she has never smoked. She has never used smokeless tobacco. She reports previous alcohol use. No history on file for drug use.  Allergies:  Allergies  Allergen Reactions  . Crestor [Rosuvastatin] Other (See Comments)    Myalgias  . Pravachol [Pravastatin] Other (See Comments)    Myalgias.  . Simvastatin Other (See Comments)    Myalgias.  . Sulfa Antibiotics     Medications: Current Outpatient Medications  Medication Instructions  . amoxicillin (AMOXIL) 500 mg, Oral, As needed  . colesevelam (WELCHOL) 625 MG tablet No dose, route, or frequency recorded.  . diclofenac sodium (VOLTAREN) 2 g, Topical, Daily  . diltiazem (CARDIZEM) 120 mg, Oral, 2 times daily  . Fish Oil 1,000 mg, Oral, 2 times daily  . gabapentin (NEURONTIN) 100 mg, Oral, Every other day  . meloxicam (MOBIC) 15 mg, Oral, Daily  . sertraline (ZOLOFT) 100 mg, Oral, Daily  . venlafaxine XR (EFFEXOR-XR) 75 mg, Oral,      ROS - all of the below systems have been reviewed with the patient and positives are indicated with bold text General: chills, fever or night sweats Eyes: blurry vision or double vision ENT: epistaxis or sore throat Allergy/Immunology: itchy/watery eyes or nasal congestion Hematologic/Lymphatic: bleeding problems, blood clots or swollen  lymph nodes Endocrine: temperature intolerance or unexpected weight changes Breast: new or changing breast lumps or nipple discharge Resp: cough, shortness of breath, or wheezing CV: chest pain or dyspnea on exertion GI: as per HPI GU: dysuria, trouble voiding, or hematuria MSK: joint pain or joint stiffness Neuro: TIA or stroke symptoms Derm: pruritus and skin lesion changes Psych: anxiety and depression  Objective   PE Blood pressure (!) 155/86, pulse 90, temperature 98.6 F (37 C), temperature source Oral, resp. rate 19, weight 80.3 kg, SpO2 92 %. Constitutional: NAD; conversant; no deformities Eyes: Moist conjunctiva; no lid lag; anicteric; PERRL Neck: Trachea midline; no thyromegaly Lungs: Normal respiratory effort; no tactile fremitus CV: RRR; no palpable thrills; no pitting edema GI: Abd tender to palpation in the right upper quadrant, positive Murphy sign; no palpable hepatosplenomegaly MSK: Normal range of motion of extremities; no clubbing/cyanosis Psychiatric: Appropriate affect; alert and oriented x3 Lymphatic: No palpable cervical or axillary lymphadenopathy  Results for orders placed or performed during the hospital encounter of 02/11/20 (from the past 24 hour(s))  Basic metabolic panel     Status: Abnormal   Collection Time: 02/11/20  8:44 AM  Result Value Ref Range   Sodium 140 135 - 145 mmol/L   Potassium 3.7 3.5 - 5.1 mmol/L   Chloride 105 98 - 111 mmol/L   CO2 23 22 - 32 mmol/L   Glucose, Bld 149 (H) 70 - 99 mg/dL   BUN 10 8 - 23 mg/dL   Creatinine, Ser 0.74 0.44 -  1.00 mg/dL   Calcium 9.6 8.9 - 10.3 mg/dL   GFR, Estimated >60 >60 mL/min   Anion gap 12 5 - 15  CBC     Status: Abnormal   Collection Time: 02/11/20  8:44 AM  Result Value Ref Range   WBC 12.9 (H) 4.0 - 10.5 K/uL   RBC 4.58 3.87 - 5.11 MIL/uL   Hemoglobin 13.9 12.0 - 15.0 g/dL   HCT 43.2 36.0 - 46.0 %   MCV 94.3 80.0 - 100.0 fL   MCH 30.3 26.0 - 34.0 pg   MCHC 32.2 30.0 - 36.0 g/dL    RDW 13.0 11.5 - 15.5 %   Platelets 271 150 - 400 K/uL   nRBC 0.0 0.0 - 0.2 %  Troponin I (High Sensitivity)     Status: None   Collection Time: 02/11/20  8:44 AM  Result Value Ref Range   Troponin I (High Sensitivity) 3 <18 ng/L  Lipase, blood     Status: None   Collection Time: 02/11/20  8:44 AM  Result Value Ref Range   Lipase 24 11 - 51 U/L  Resp Panel by RT-PCR (Flu A&B, Covid) Nasopharyngeal Swab     Status: None   Collection Time: 02/11/20  1:27 PM   Specimen: Nasopharyngeal Swab; Nasopharyngeal(NP) swabs in vial transport medium  Result Value Ref Range   SARS Coronavirus 2 by RT PCR NEGATIVE NEGATIVE   Influenza A by PCR NEGATIVE NEGATIVE   Influenza B by PCR NEGATIVE NEGATIVE  Troponin I (High Sensitivity)     Status: None   Collection Time: 02/11/20  3:37 PM  Result Value Ref Range   Troponin I (High Sensitivity) 5 <18 ng/L  Hepatic function panel     Status: Abnormal   Collection Time: 02/11/20  3:37 PM  Result Value Ref Range   Total Protein 6.8 6.5 - 8.1 g/dL   Albumin 4.1 3.5 - 5.0 g/dL   AST 26 15 - 41 U/L   ALT 23 0 - 44 U/L   Alkaline Phosphatase 70 38 - 126 U/L   Total Bilirubin 1.0 0.3 - 1.2 mg/dL   Bilirubin, Direct 0.3 (H) 0.0 - 0.2 mg/dL   Indirect Bilirubin 0.7 0.3 - 0.9 mg/dL     Imaging Orders     DG Chest 2 View     US Abdomen Limited RUQ (LIVER/GB)     CT ABDOMEN PELVIS W CONTRAST   Assessment and Plan   Debra Mcbride is an 69 y.o. female with abdominal pain and work-up consistent for cholecystitis.  I recommended laparoscopic cholecystectomy for the patient.  The procedure itself as well as its risk benefits and alternatives were discussed with the patient.  After full discussion and all questions answered the patient granted consent to proceed.  We will admit her to the general surgery service make her n.p.o. give her IV fluids give her prophylactic antibiotics and proceed to the operating room once time is available likely on Sunday.  Louanna Raw, M.D. General, Bariatric and Minimally Invasive Surgery  [facility] Use [email or web address] to contact on call provider

## 2020-02-11 NOTE — ED Notes (Signed)
Attempted report 

## 2020-02-11 NOTE — ED Notes (Signed)
Pt to CT at this time.

## 2020-02-11 NOTE — ED Provider Notes (Signed)
[facility] EMERGENCY DEPARTMENT Provider Note   CSN: [id number] Arrival date & time: 02/11/20  4098     History No chief complaint on file.   Debra Mcbride is a 69 y.o. female.  The history is provided by the patient.  Abdominal Pain Pain location:  Epigastric and RUQ Pain quality: aching, cramping, gnawing and shooting   Pain radiates to:  Does not radiate Pain severity:  Severe Onset quality:  Sudden Duration:  12 hours Timing:  Constant Progression:  Unchanged Chronicity:  New Context: awakening from sleep   Relieved by:  Nothing Worsened by:  Movement and vomiting (trying to take some medicine) Ineffective treatments: tried to take a hydrocodone but just vomited it up. Associated symptoms: anorexia, nausea and vomiting   Associated symptoms: no cough, no diarrhea, no fever, no flatus and no shortness of breath   Associated symptoms comment:  Reports that yesterday she felt a little off.  Mostly just tired but was able to eat and didn't have any other symptoms and woke up at 1am with the pain and it hasn't gone away. Risk factors: has not had multiple surgeries   Risk factors comment:  Never had abdominal surgery.  no new medications      Past Medical History:  Diagnosis Date  . High cholesterol   . Hypertension     There are no problems to display for this patient.   Past Surgical History:  Procedure Laterality Date  . REPLACEMENT TOTAL KNEE Right      OB History   No obstetric history on file.     No family history on file.  Social History   Tobacco Use  . Smoking status: Never Smoker  . Smokeless tobacco: Never Used  Substance Use Topics  . Alcohol use: Not Currently    Home Medications Prior to Admission medications   Medication Sig Start Date End Date Taking? Authorizing Provider  colesevelam [facility]) 625 MG tablet  08/08/19   [provider]  diltiazem (CARDIZEM) 120 MG tablet diltiazem 120 mg tablet 01/29/15    [provider]  meloxicam (MOBIC) 15 MG tablet Take 15 mg by mouth daily. 07/31/19   [provider]  venlafaxine (EFFEXOR-XR) 75 MG 24 hr capsule Take 75 mg by mouth.      [provider]    Allergies    Sulfa antibiotics  Review of Systems   Review of Systems  Constitutional: Negative for fever.  Respiratory: Negative for cough and shortness of breath.   Gastrointestinal: Positive for abdominal pain, anorexia, nausea and vomiting. Negative for diarrhea and flatus.  All other systems reviewed and are negative.   Physical Exam Updated Vital Signs BP (!) 152/88 (BP Location: Right Arm)   Pulse 66   Temp 98.6 F (37 C) (Oral)   Resp 18   Wt 80.3 kg   SpO2 97%   BMI 32.37 kg/m   Physical Exam Vitals and nursing note reviewed.  Constitutional:      General: She is not in acute distress.    Appearance: She is well-developed, normal weight and well-nourished.  HENT:     Head: Normocephalic and atraumatic.     Mouth/Throat:     Mouth: Mucous membranes are dry.  Eyes:     Extraocular Movements: EOM normal.     Pupils: Pupils are equal, round, and reactive to light.  Cardiovascular:     Rate and Rhythm: Normal rate and regular rhythm.     Pulses: Normal  pulses and intact distal pulses.     Heart sounds: Normal heart sounds. No murmur heard. No friction rub.  Pulmonary:     Effort: Pulmonary effort is normal.     Breath sounds: Normal breath sounds. No wheezing or rales.  Abdominal:     General: Bowel sounds are normal. There is no distension.     Palpations: Abdomen is soft.     Tenderness: There is abdominal tenderness in the right upper quadrant and epigastric area. There is guarding. There is no right CVA tenderness, left CVA tenderness or rebound. Positive signs include Murphy's sign.  Musculoskeletal:        General: No tenderness. Normal range of motion.     Cervical back: Neck supple.     Right lower leg: No edema.     Left lower leg:  No edema.     Comments: No edema  Skin:    General: Skin is warm and dry.     Findings: No rash.  Neurological:     General: No focal deficit present.     Mental Status: She is alert and oriented to person, place, and time. Mental status is at baseline.     Cranial Nerves: No cranial nerve deficit.  Psychiatric:        Mood and Affect: Mood and affect and mood normal.        Behavior: Behavior normal.        Thought Content: Thought content normal.     ED Results / Procedures / Treatments   Labs (all labs ordered are listed, but only abnormal results are displayed) Labs Reviewed  BASIC METABOLIC PANEL - Abnormal; Notable for the following components:      Result Value   Glucose, Bld 149 (*)    All other components within normal limits  CBC - Abnormal; Notable for the following components:   WBC 12.9 (*)    All other components within normal limits  HEPATIC FUNCTION PANEL - Abnormal; Notable for the following components:   Bilirubin, Direct 0.3 (*)    All other components within normal limits  RESP PANEL BY RT-PCR (FLU A&B, COVID) ARPGX2  LIPASE, BLOOD  TROPONIN I (HIGH SENSITIVITY)  TROPONIN I (HIGH SENSITIVITY)    EKG EKG Interpretation  Date/Time:  Saturday February 11 2020 08:29:50 EST Ventricular Rate:  62 PR Interval:  154 QRS Duration: 80 QT Interval:  428 QTC Calculation: 434 R Axis:   48 Text Interpretation: Normal sinus rhythm with sinus arrhythmia Cannot rule out Anterior infarct , age undetermined No previous tracing Confirmed by Blanchie Dessert [phone]) on 02/11/2020 12:59:23 PM   Radiology DG Chest 2 View  Result Date: 02/11/2020 CLINICAL DATA:  epigastric pain EXAM: CHEST - 2 VIEW COMPARISON:  08/12/2016 and prior FINDINGS: No focal consolidation. No pneumothorax or pleural effusion. Cardiomediastinal silhouette is within normal limits. No acute osseous abnormality. IMPRESSION: No focal airspace disease. Electronically Signed   By: Primitivo Gauze M.D.   On: 02/11/2020 08:49   CT ABDOMEN PELVIS W CONTRAST  Addendum Date: 02/11/2020   ADDENDUM REPORT: 02/11/2020 17:56 ADDENDUM: These results were called by telephone at the time of interpretation on 02/11/2020 at 5:55 pm to provider [facility] , who verbally acknowledged these results. Electronically Signed   By: Iven Finn M.D.   On: 02/11/2020 17:56   Result Date: 02/11/2020 CLINICAL DATA:  Nonlocalized abdominal pain. White blood cell count 12.9, direct bilirubin 0.3. EXAM: CT ABDOMEN AND PELVIS WITH CONTRAST TECHNIQUE: Multidetector  CT imaging of the abdomen and pelvis was performed using the standard protocol following bolus administration of intravenous contrast. CONTRAST:  158mL OMNIPAQUE IOHEXOL 300 MG/ML  SOLN COMPARISON:  CT abdomen pelvis 08/18/2016. ultrasound abdomen 02/11/2020 FINDINGS: Lower chest: By lateral lower lobe subsegmental atelectasis. Otherwise no acute abnormality. Hepatobiliary: The hepatic parenchyma is diffusely slightly hypodense compared to the splenic parenchyma. No focal hepatic lesion. Several calcified gallstones noted within the gallbladder as well as a gallstone located within the gallbladder neck that may be impacted. Gallbladder wall thickening measuring up to 5 mm. Trace pericholecystic fluid. No biliary ductal dilatation. Pancreas: No focal lesion. Normal pancreatic contour. No surrounding inflammatory changes. No main pancreatic ductal dilatation. Spleen: Normal in size without focal abnormality. Adrenals/Urinary Tract: No adrenal nodule bilaterally. Bilateral kidneys enhance symmetrically. No hydronephrosis. No hydroureter. On delayed imaging, there is no urothelial wall thickening and there are no filling defects in the opacified portions of the bilateral collecting systems or ureters. The urinary bladder is unremarkable. Stomach/Bowel: Stomach is within normal limits. No evidence of bowel wall thickening or dilatation. Few scattered  colonic diverticula. Appendix appears normal. Vascular/Lymphatic: No abdominal aorta or iliac aneurysm. At least mild atherosclerotic plaque of the aorta and its branches. No abdominal, pelvic, or inguinal lymphadenopathy. Reproductive: Prostate is unremarkable. Other: Trace simple free fluid within the pelvis as well as along the right kidney and gallbladder. No intraperitoneal free gas. No organized fluid collection. Musculoskeletal: No abdominal wall hernia or abnormality No suspicious lytic or blastic osseous lesions. No acute displaced fracture. Multilevel degenerative changes of the spine. IMPRESSION: 1. Cholelithiasis with acute cholecystitis and a likely impacted gallstone within the gallbladder neck. No choledocholithiasis. Recommend surgical consultation. 2. Mild hepatic steatosis. 3. Few scattered colonic diverticula with no acute diverticulitis. 4.  Aortic Atherosclerosis (ICD10-I70.0). Electronically Signed: By: Iven Finn M.D. On: 02/11/2020 17:51   US Abdomen Limited RUQ (LIVER/GB)  Result Date: 02/11/2020 CLINICAL DATA:  Right upper quadrant pain EXAM: ULTRASOUND ABDOMEN LIMITED RIGHT UPPER QUADRANT COMPARISON:  None. FINDINGS: Gallbladder: Sludge and gallstones within the gallbladder. Gallbladder wall thickening up to 6 mm. No sonographic Murphy sign noted by sonographer. Common bile duct: Diameter: 2 mm Liver: No focal lesion identified. Mildly increased parenchymal echogenicity. Portal vein is patent on color Doppler imaging with normal direction of blood flow towards the liver. Other: None. IMPRESSION: 1. Sludge and gallstones within the gallbladder with gallbladder wall thickening. No pericholecystic fluid. No biliary ductal dilatation. Negative sonographic Murphy sign. Ultrasound findings are equivocal for acute cholecystitis. Consider HIDA to further evaluate for patency of the cystic and common bile ducts if there is clinical concern for acute cholecystitis. 2. Mild hepatic  steatosis. Electronically Signed   By: Eddie Candle M.D.   On: 02/11/2020 14:33    Procedures Procedures (including critical care time)  Medications Ordered in ED Medications  morphine 4 MG/ML injection 4 mg (has no administration in time range)  ondansetron (ZOFRAN) injection 4 mg (has no administration in time range)  lactated ringers bolus 1,000 mL (has no administration in time range)    ED Course  I have reviewed the triage vital signs and the nursing notes.  Pertinent labs & imaging results that were available during my care of the patient were reviewed by me and considered in my medical decision making (see chart for details).    MDM Rules/Calculators/A&P  Patient is a 69 year old female presenting today with abrupt onset of abdominal pain that woke her from sleep at 1 AM this morning.  She reports she is having vomiting at least every hour that is green and nonbloody.  The pain has not resolved and she reports it is a 10 out of 10.  It does not radiate.  She denies any urinary symptoms and has had no diarrhea and does not think she has been passing flatus.  She had no prior abdominal surgeries and had a colonoscopy 5 years ago that was normal.  Patient denies any infectious symptoms reports yesterday she felt tired but had no other specific complaints.  Patient has epigastric and right upper quadrant tenderness with a positive Murphy sign on exam.  Labs are significant for a CBC with a leukocytosis of 13,000 but a normal BMP and lipase.  Will add on LFTs and Covid.  Right upper quadrant ultrasound pending.  Patient given IV fluids pain and nausea control.  Concern for possible gallbladder pathology versus bowel obstruction versus colitis.  Lower suspicion for perforated ulcer at this time as patient does not take NSAIDs and has no prior history of GERD or ulcers.  Low suspicion for cardiac etiology.  Patient's chest x-ray troponin and EKG without acute  findings.  2:53 PM Ultrasound shows sludge and gallstones within the gallbladder with gallbladder wall thickening but no pericholecystic cystic fluid or biliary ductal dilation.  There is no Murphy sign on evaluation and they report that the ultrasound is equivocal for acute cholecystitis.  Did report considering a HIDA scan for further evaluation.  2:56 PM LFTs are still pending.  However on reevaluation patient now has mostly epigastric pain.  Could be still acute cholecystitis but will do a CT to rule out other causes.  Patient given more pain control.  On reevaluation patient's Percell Miller sign is now no longer present.  6:00 PM CT is consistent with acute cholecystitis and concern for gallstone impacted in the neck of the gallbladder.  Repeat evaluation patient is still having pain.  She was started on IV Rocephin.  Also given more pain control.  General surgery consulted for further care.  LFTs are within normal limits.  MDM Number of Diagnoses or Management Options   Amount and/or Complexity of Data Reviewed Clinical lab tests: ordered and reviewed Tests in the radiology section of CPT: reviewed and ordered Tests in the medicine section of CPT: ordered and reviewed Decide to obtain previous medical records or to obtain history from someone other than the patient: yes Obtain history from someone other than the patient: no Review and summarize past medical records: yes Discuss the patient with other providers: yes Independent visualization of images, tracings, or specimens: yes  Risk of Complications, Morbidity, and/or Mortality Presenting problems: high Diagnostic procedures: low Management options: low  Patient Progress Patient progress: stable    Final Clinical Impression(s) / ED Diagnoses Final diagnoses:  RUQ abdominal pain    Rx / DC Orders ED Discharge Orders    None       Blanchie Dessert, MD 02/11/20 1802

## 2020-02-11 NOTE — ED Notes (Signed)
Pain med given 

## 2020-02-12 ENCOUNTER — Encounter (HOSPITAL_COMMUNITY): Admission: EM | Disposition: A | Discharge status: Home / Self Care | Payer: Self-pay | Source: Home / Self Care

## 2020-02-12 ENCOUNTER — Encounter (HOSPITAL_COMMUNITY): Payer: Self-pay

## 2020-02-12 ENCOUNTER — Observation Stay (HOSPITAL_COMMUNITY): Payer: Medicare Other | Admitting: Certified Registered"

## 2020-02-12 DIAGNOSIS — K82A1 Gangrene of gallbladder in cholecystitis: Secondary | ICD-10-CM | POA: Diagnosis not present

## 2020-02-12 DIAGNOSIS — Z96651 Presence of right artificial knee joint: Secondary | ICD-10-CM | POA: Diagnosis not present

## 2020-02-12 DIAGNOSIS — K8000 Calculus of gallbladder with acute cholecystitis without obstruction: Secondary | ICD-10-CM | POA: Diagnosis not present

## 2020-02-12 DIAGNOSIS — E785 Hyperlipidemia, unspecified: Secondary | ICD-10-CM | POA: Diagnosis not present

## 2020-02-12 DIAGNOSIS — I10 Essential (primary) hypertension: Secondary | ICD-10-CM | POA: Diagnosis not present

## 2020-02-12 DIAGNOSIS — K8066 Calculus of gallbladder and bile duct with acute and chronic cholecystitis without obstruction: Secondary | ICD-10-CM | POA: Diagnosis not present

## 2020-02-12 DIAGNOSIS — E7800 Pure hypercholesterolemia, unspecified: Secondary | ICD-10-CM | POA: Diagnosis not present

## 2020-02-12 DIAGNOSIS — Z20822 Contact with and (suspected) exposure to covid-19: Secondary | ICD-10-CM | POA: Diagnosis not present

## 2020-02-12 DIAGNOSIS — Z882 Allergy status to sulfonamides status: Secondary | ICD-10-CM | POA: Diagnosis not present

## 2020-02-12 DIAGNOSIS — Z79899 Other long term (current) drug therapy: Secondary | ICD-10-CM | POA: Diagnosis not present

## 2020-02-12 DIAGNOSIS — K810 Acute cholecystitis: Secondary | ICD-10-CM | POA: Diagnosis not present

## 2020-02-12 DIAGNOSIS — I700 Atherosclerosis of aorta: Secondary | ICD-10-CM | POA: Diagnosis not present

## 2020-02-12 DIAGNOSIS — K760 Fatty (change of) liver, not elsewhere classified: Secondary | ICD-10-CM | POA: Diagnosis not present

## 2020-02-12 DIAGNOSIS — K8012 Calculus of gallbladder with acute and chronic cholecystitis without obstruction: Secondary | ICD-10-CM | POA: Diagnosis not present

## 2020-02-12 HISTORY — PX: CHOLECYSTECTOMY: SHX55

## 2020-02-12 LAB — COMPREHENSIVE METABOLIC PANEL
ALT: 25 U/L (ref 0–44)
AST: 34 U/L (ref 15–41)
Albumin: 3.4 g/dL — ABNORMAL LOW (ref 3.5–5.0)
Alkaline Phosphatase: 64 U/L (ref 38–126)
Anion gap: 10 (ref 5–15)
BUN: 8 mg/dL (ref 8–23)
CO2: 26 mmol/L (ref 22–32)
Calcium: 9.3 mg/dL (ref 8.9–10.3)
Chloride: 101 mmol/L (ref 98–111)
Creatinine, Ser: 0.77 mg/dL (ref 0.44–1.00)
GFR, Estimated: >60 mL/min (ref >60)
Glucose, Bld: 137 mg/dL — ABNORMAL HIGH (ref 70–99)
Potassium: 3.8 mmol/L (ref 3.5–5.1)
Sodium: 137 mmol/L (ref 135–145)
Total Bilirubin: 0.6 mg/dL (ref 0.3–1.2)
Total Protein: 6.5 g/dL (ref 6.5–8.1)

## 2020-02-12 LAB — CBC
HCT: 42.4 % (ref 36.0–46.0)
Hemoglobin: 14.5 g/dL (ref 12.0–15.0)
MCH: 31.4 pg (ref 26.0–34.0)
MCHC: 34.2 g/dL (ref 30.0–36.0)
MCV: 91.8 fL (ref 80.0–100.0)
Platelets: 267 10*3/uL (ref 150–400)
RBC: 4.62 MIL/uL (ref 3.87–5.11)
RDW: 13.1 % (ref 11.5–15.5)
WBC: 19.9 10*3/uL — ABNORMAL HIGH (ref 4.0–10.5)
nRBC: 0 % (ref 0.0–0.2)

## 2020-02-12 LAB — HIV ANTIBODY (ROUTINE TESTING W REFLEX): HIV Screen 4th Generation wRfx: NONREACTIVE

## 2020-02-12 SURGERY — LAPAROSCOPIC CHOLECYSTECTOMY
Anesthesia: General | Site: Abdomen

## 2020-02-12 MED ORDER — HYDROMORPHONE HCL 1 MG/ML IJ SOLN: 0.2500 mg | INTRAMUSCULAR | Status: DC | PRN

## 2020-02-12 MED ORDER — AMISULPRIDE (ANTIEMETIC) 5 MG/2ML IV SOLN: 10.0000 mg | Freq: Once | INTRAVENOUS | Status: DC | PRN

## 2020-02-12 MED ORDER — SUGAMMADEX SODIUM 200 MG/2ML IV SOLN
INTRAVENOUS | Status: DC | PRN
  Administered 2020-02-12: 160 mg via INTRAVENOUS

## 2020-02-12 MED ORDER — CHLORHEXIDINE GLUCONATE 0.12 % MT SOLN: 15.0000 mL | Freq: Once | OROMUCOSAL | Status: AC

## 2020-02-12 MED ORDER — PROPOFOL 10 MG/ML IV BOLUS
INTRAVENOUS | Status: DC | PRN
  Administered 2020-02-12: 150 mg via INTRAVENOUS

## 2020-02-12 MED ORDER — LIDOCAINE HCL (CARDIAC) PF 100 MG/5ML IV SOSY
PREFILLED_SYRINGE | INTRAVENOUS | Status: DC | PRN
  Administered 2020-02-12: 60 mg via INTRAVENOUS

## 2020-02-12 MED ORDER — SUCCINYLCHOLINE CHLORIDE 200 MG/10ML IV SOSY
PREFILLED_SYRINGE | INTRAVENOUS | Status: AC
  Filled 2020-02-12: qty 10

## 2020-02-12 MED ORDER — SODIUM CHLORIDE 0.9 % IR SOLN
Status: DC | PRN
  Administered 2020-02-12: 1000 mL

## 2020-02-12 MED ORDER — BUPIVACAINE-EPINEPHRINE 0.5% -1:200000 IJ SOLN
INTRAMUSCULAR | Status: AC
  Filled 2020-02-12: qty 1

## 2020-02-12 MED ORDER — CHLORHEXIDINE GLUCONATE 0.12 % MT SOLN
OROMUCOSAL | Status: AC
  Administered 2020-02-12: 15 mL via OROMUCOSAL
  Filled 2020-02-12: qty 15

## 2020-02-12 MED ORDER — PHENYLEPHRINE 40 MCG/ML (10ML) SYRINGE FOR IV PUSH (FOR BLOOD PRESSURE SUPPORT)
PREFILLED_SYRINGE | INTRAVENOUS | Status: AC
  Filled 2020-02-12: qty 10

## 2020-02-12 MED ORDER — ROCURONIUM BROMIDE 10 MG/ML (PF) SYRINGE
PREFILLED_SYRINGE | INTRAVENOUS | Status: AC
  Filled 2020-02-12: qty 10

## 2020-02-12 MED ORDER — PHENYLEPHRINE 40 MCG/ML (10ML) SYRINGE FOR IV PUSH (FOR BLOOD PRESSURE SUPPORT)
PREFILLED_SYRINGE | INTRAVENOUS | Status: DC | PRN
  Administered 2020-02-12 (×3): 40 ug via INTRAVENOUS

## 2020-02-12 MED ORDER — LIDOCAINE HCL (PF) 2 % IJ SOLN
INTRAMUSCULAR | Status: AC
  Filled 2020-02-12: qty 5

## 2020-02-12 MED ORDER — BUPIVACAINE HCL (PF) 0.25 % IJ SOLN
INTRAMUSCULAR | Status: AC
  Filled 2020-02-12: qty 30

## 2020-02-12 MED ORDER — 0.9 % SODIUM CHLORIDE (POUR BTL) OPTIME
TOPICAL | Status: DC | PRN
  Administered 2020-02-12: 1000 mL

## 2020-02-12 MED ORDER — ONDANSETRON HCL 4 MG/2ML IJ SOLN
INTRAMUSCULAR | Status: AC
  Filled 2020-02-12: qty 2

## 2020-02-12 MED ORDER — PROMETHAZINE HCL 25 MG/ML IJ SOLN: 6.2500 mg | INTRAMUSCULAR | Status: DC | PRN

## 2020-02-12 MED ORDER — CEFAZOLIN SODIUM 1 G IJ SOLR
INTRAMUSCULAR | Status: DC | PRN
  Administered 2020-02-12: 2 g via INTRAMUSCULAR

## 2020-02-12 MED ORDER — DEXAMETHASONE SODIUM PHOSPHATE 4 MG/ML IJ SOLN
INTRAMUSCULAR | Status: DC | PRN
  Administered 2020-02-12: 4 mg via INTRAVENOUS

## 2020-02-12 MED ORDER — PROPOFOL 10 MG/ML IV BOLUS
INTRAVENOUS | Status: AC
  Filled 2020-02-12: qty 20

## 2020-02-12 MED ORDER — LACTATED RINGERS IV SOLN: INTRAVENOUS | Status: DC

## 2020-02-12 MED ORDER — ROCURONIUM BROMIDE 100 MG/10ML IV SOLN
INTRAVENOUS | Status: DC | PRN
  Administered 2020-02-12: 50 mg via INTRAVENOUS

## 2020-02-12 MED ORDER — FENTANYL CITRATE (PF) 250 MCG/5ML IJ SOLN
INTRAMUSCULAR | Status: AC
  Filled 2020-02-12: qty 5

## 2020-02-12 MED ORDER — ONDANSETRON HCL 4 MG/2ML IJ SOLN
INTRAMUSCULAR | Status: DC | PRN
  Administered 2020-02-12: 4 mg via INTRAVENOUS

## 2020-02-12 MED ORDER — ORAL CARE MOUTH RINSE: 15.0000 mL | Freq: Once | OROMUCOSAL | Status: AC

## 2020-02-12 MED ORDER — DEXAMETHASONE SODIUM PHOSPHATE 10 MG/ML IJ SOLN
INTRAMUSCULAR | Status: AC
  Filled 2020-02-12: qty 1

## 2020-02-12 MED ORDER — BUPIVACAINE-EPINEPHRINE 0.5% -1:200000 IJ SOLN
INTRAMUSCULAR | Status: DC | PRN
  Administered 2020-02-12: 6 mL

## 2020-02-12 MED ORDER — FENTANYL CITRATE (PF) 100 MCG/2ML IJ SOLN
INTRAMUSCULAR | Status: DC | PRN
  Administered 2020-02-12: 100 ug via INTRAVENOUS
  Administered 2020-02-12: 50 ug via INTRAVENOUS

## 2020-02-12 SURGICAL SUPPLY — 39 items
APPLIER CLIP 5 13 M/L LIGAMAX5 (MISCELLANEOUS) ×2
CANISTER SUCT 3000ML PPV (MISCELLANEOUS) ×2 IMPLANT
CHLORAPREP W/TINT 26 (MISCELLANEOUS) ×2 IMPLANT
CLIP APPLIE 5 13 M/L LIGAMAX5 (MISCELLANEOUS) ×1 IMPLANT
COVER SURGICAL LIGHT HANDLE (MISCELLANEOUS) ×2 IMPLANT
DERMABOND ADVANCED (GAUZE/BANDAGES/DRESSINGS) ×1
DERMABOND ADVANCED .7 DNX12 (GAUZE/BANDAGES/DRESSINGS) ×1 IMPLANT
ELECT REM PT RETURN 9FT ADLT (ELECTROSURGICAL) ×2
ELECTRODE REM PT RTRN 9FT ADLT (ELECTROSURGICAL) ×1 IMPLANT
GLOVE BIOGEL PI IND STRL 6 (GLOVE) ×3 IMPLANT
GLOVE BIOGEL PI INDICATOR 6 (GLOVE) ×3
GLOVE BIOGEL PI MICRO 5.5 (GLOVE) ×4
GLOVE BIOGEL PI MICRO STRL 5.5 (GLOVE) ×4 IMPLANT
GOWN STRL REUS W/ TWL LRG LVL3 (GOWN DISPOSABLE) ×4 IMPLANT
GOWN STRL REUS W/TWL LRG LVL3 (GOWN DISPOSABLE) ×4
KIT BASIN OR (CUSTOM PROCEDURE TRAY) ×2 IMPLANT
KIT TURNOVER KIT B (KITS) ×2 IMPLANT
L-HOOK LAP DISP 36CM (ELECTROSURGICAL) ×2
LHOOK LAP DISP 36CM (ELECTROSURGICAL) ×1 IMPLANT
NEEDLE INSUFFLATION 14GA 120MM (NEEDLE) IMPLANT
NS IRRIG 1000ML POUR BTL (IV SOLUTION) ×2 IMPLANT
PAD ARMBOARD 7.5X6 YLW CONV (MISCELLANEOUS) ×2 IMPLANT
PENCIL BUTTON HOLSTER BLD 10FT (ELECTRODE) ×2 IMPLANT
POUCH SPECIMEN RETRIEVAL 10MM (ENDOMECHANICALS) ×2 IMPLANT
SCISSORS LAP 5X35 DISP (ENDOMECHANICALS) ×2 IMPLANT
SET IRRIG TUBING LAPAROSCOPIC (IRRIGATION / IRRIGATOR) ×2 IMPLANT
SET TUBE SMOKE EVAC HIGH FLOW (TUBING) ×2 IMPLANT
SLEEVE ENDOPATH XCEL 5M (ENDOMECHANICALS) ×6 IMPLANT
SPECIMEN JAR SMALL (MISCELLANEOUS) ×2 IMPLANT
SUT MNCRL AB 4-0 PS2 18 (SUTURE) ×2 IMPLANT
SUT VIC AB 3-0 SH 27 (SUTURE)
SUT VIC AB 3-0 SH 27XBRD (SUTURE) IMPLANT
SUT VICRYL 0 UR6 27IN ABS (SUTURE) ×6 IMPLANT
TOWEL GREEN STERILE (TOWEL DISPOSABLE) ×2 IMPLANT
TOWEL GREEN STERILE FF (TOWEL DISPOSABLE) ×2 IMPLANT
TRAY LAPAROSCOPIC MC (CUSTOM PROCEDURE TRAY) ×2 IMPLANT
TROCAR XCEL 12X100 BLDLESS (ENDOMECHANICALS) IMPLANT
TROCAR XCEL BLUNT TIP 100MML (ENDOMECHANICALS) ×2 IMPLANT
TROCAR XCEL NON-BLD 5MMX100MML (ENDOMECHANICALS) ×2 IMPLANT

## 2020-02-12 NOTE — Anesthesia Preprocedure Evaluation (Signed)
Anesthesia Evaluation  Patient identified by MRN, date of birth, ID band Patient awake    Reviewed: Patient's Chart, lab work & pertinent test results  History of Anesthesia Complications (+) PONV  Airway Mallampati: II  TM Distance: >3 FB Neck ROM: Full    Dental  (+) Teeth Intact   Pulmonary neg pulmonary ROS,    Pulmonary exam normal        Cardiovascular hypertension, Pt. on medications  Rhythm:Regular Rate:Normal     Neuro/Psych negative neurological ROS  negative psych ROS   GI/Hepatic Neg liver ROS, cholecystitis   Endo/Other  negative endocrine ROS  Renal/GU negative Renal ROS  negative genitourinary   Musculoskeletal negative musculoskeletal ROS (+)   Abdominal (+)  Abdomen: soft. Bowel sounds: normal.  Peds  Hematology negative hematology ROS (+)   Anesthesia Other Findings   Reproductive/Obstetrics                             Anesthesia Physical Anesthesia Plan  ASA: II  Anesthesia Plan: General   Post-op Pain Management:    Induction: Intravenous  PONV Risk Score and Plan: 4 or greater and Ondansetron, Dexamethasone and Treatment may vary due to age or medical condition  Airway Management Planned: Mask and Oral ETT  Additional Equipment: None  Intra-op Plan:   Post-operative Plan: Extubation in OR  Informed Consent: I have reviewed the patients History and Physical, chart, labs and discussed the procedure including the risks, benefits and alternatives for the proposed anesthesia with the patient or authorized representative who has indicated his/her understanding and acceptance.     Dental advisory given  Plan Discussed with: CRNA  Anesthesia Plan Comments: (Lab Results      Component                Value               Date                      WBC                      19.9 (H)            02/12/2020                HGB                      14.5                 02/12/2020                HCT                      42.4                02/12/2020                MCV                      91.8                02/12/2020                PLT                      267  02/12/2020           Lab Results      Component                Value               Date                      ALT                      25                  02/12/2020                AST                      34                  02/12/2020                ALKPHOS                  64                  02/12/2020                BILITOT                  0.6                 02/12/2020          )        Anesthesia Quick Evaluation

## 2020-02-12 NOTE — Transfer of Care (Signed)
Immediate Anesthesia Transfer of Care Note  Patient: Debra Mcbride  Procedure(s) Performed: LAPAROSCOPIC CHOLECYSTECTOMY (N/A Abdomen)  Patient Location: PACU  Anesthesia Type:General  Level of Consciousness: awake, alert , oriented and patient cooperative  Airway & Oxygen Therapy: Patient Spontanous Breathing and Patient connected to face mask oxygen  Post-op Assessment: Report given to RN and Post -op Vital signs reviewed and stable  Post vital signs: Reviewed and stable  Last Vitals:  Vitals Value Taken Time  BP 130/73 02/12/20 0919  Temp 36.8 C 02/12/20 0917  Pulse 95 02/12/20 0929  Resp 14 02/12/20 0929  SpO2 93 % 02/12/20 0929  Vitals shown include unvalidated device data.  Last Pain:  Vitals:   02/12/20 0917  TempSrc:   PainSc: Asleep      Patients Stated Pain Goal: 3 (48/84/57 3344)  Complications: No complications documented.

## 2020-02-12 NOTE — Op Note (Addendum)
Patient: Debra Mcbride MRN: [id number]  Date of Procedure: 02/12/20  Procedure: Laparoscopic cholecystectomy  Indications: 69 y.o. female who is here for abdominal pain.  Her pain started this morning and became progressively worse throughout the day.  Is very severe now and only was relieved when she had pain medication.  Pain is located in the right upper quadrant and epigastric area. CT scan showed markedly distended gallbladder.  Pre-operative Diagnosis: Acute cholecystitis  Post-operative Diagnosis: Gangrenous cholecystitis  Surgeon: Michaelle Birks, MD  Assistants: Sheria Lang, MD  Anesthesia: General endotracheal anesthesia  ASA Class: 3  Findings: Acute gangrenous cholecystitis with large stone impacted at the neck of the gallbladder.  Estimated Blood Loss: 20 ml         Drains: None         Specimens: Gallbladder           Complications: None; patient tolerated the procedure well.         Disposition: PACU - hemodynamically stable.         Condition: stable  Procedure Details  The patient was seen again in the Holding Room. The risks, benefits, complications, treatment options, and expected outcomes were discussed with the patient. The possibilities of reaction to medication, pulmonary aspiration, perforation of viscus, bleeding, recurrent infection, finding a normal gallbladder, the need for additional procedures, failure to diagnose a condition, the possible need to convert to an open procedure, and creating a complication requiring transfusion or operation were discussed with the patient. The likelihood of improving the patient's symptoms with return to their baseline status is good.  The patient and/or family concurred with the proposed plan, giving informed consent. The site of surgery properly noted. The patient was taken to Operating Room, identified as Debra Mcbride and the procedure verified as Laparoscopic Cholecystectomy. A Time Out was held and the above  information confirmed.  Prior to the induction of general anesthesia, antibiotic prophylaxis was administered. General endotracheal anesthesia was then administered and tolerated well. After the induction, the abdomen was prepped with Chloraprep and draped in sterile fashion. The patient was positioned in the supine position.  An infraumbilical incision was made. We dissected down to the abdominal fascia with blunt dissection. The fascia was incised vertically and we entered the peritoneal cavity bluntly.  Twp 0-Vicryl sutures were placed on either side of the fascial opening.  A 11mm Hasson trocar was inserted and secured with the stay sutures.  Pneumoperitoneum was then created with CO2 and tolerated well without any adverse changes in the patient's vital signs. A 5-mm port was placed in the subxiphoid position.  Two 5-mm ports were placed in the right upper quadrant. All skin incisions were infiltrated with a local anesthetic agent before making the incision and placing the trocars.   We positioned the patient in reverse Trendelenburg, tilted slightly to the patient's left.  The gallbladder was identified, the fundus grasped and retracted cephalad. The omentum was adherent to the gallbladder, and this was taken down bluntly. The gallbladder was markedly distended and gangrenous in appearance. It was needle decompressed to aid in retraction. The infundibulum was grasped and retracted laterally, exposing the peritoneum overlying the triangle of Calot. There was a large stone impacted in the infundibulum making retraction difficult. The peritoneum and adhesions in the triangle of Calot were freed with a combination of blunt dissection and electrocautery. The critical view of safety was obtained. The cystic artery was clipped and divided. The cystic duct was clearly identified and dissected circumferentially with  a combination of blunt dissection and electrocautery. The cystic duct was then ligated with clips  and divided.  The gallbladder was dissected off the liver using cautery, and placed in an endocatch. The liver bed was irrigated and inspected. Hemostasis was achieved with the electrocautery. The surgical site was irrigated with saline until the effluent was clear. The trocars were removed and pneumoperitoneum evacuated. The gallbladder was removed removed through the umbilical port site, which required extension of the fascial defect due to the large stone in the gallbladder. The periumbilical fascia was closed with multiple 0-Vicryl figure of eight sutures. 4-0 Monocryl was used to close the skin  in subcuticular fashion and dermabdon was applied.   The patient was then extubated and brought to the recovery room in stable condition. She tolerated the procedure well with no apparent complications. Instrument, sponge, and needle counts were correct at closure and at the conclusion of the case.   I was present for the entirety of the procedure.  I have reviewed and edited the op note as dictated by the resident.  Michaelle Birks, [facility] 02/12/20 11:47 PM

## 2020-02-12 NOTE — Anesthesia Procedure Notes (Signed)
Procedure Name: Intubation Date/Time: 02/12/2020 7:50 AM Performed by: Sammie Bench, CRNA Pre-anesthesia Checklist: Patient identified, Emergency Drugs available, Suction available and Patient being monitored Patient Re-evaluated:Patient Re-evaluated prior to induction Oxygen Delivery Method: Circle System Utilized Preoxygenation: Pre-oxygenation with 100% oxygen Induction Type: IV induction Ventilation: Mask ventilation without difficulty Laryngoscope Size: Mac and 3 Grade View: Grade I Tube type: Oral Tube size: 7.0 mm Number of attempts: 3 (First two looks per CRNA unable to pass ETT, 3rd look per anesthesiologist, ETT passed with some dofficulty. +ETCO2 +BBS) Airway Equipment and Method: Stylet and Oral airway Placement Confirmation: ETT inserted through vocal cords under direct vision,  positive ETCO2 and breath sounds checked- equal and bilateral Secured at: 21 cm Tube secured with: Tape Dental Injury: Teeth and Oropharynx as per pre-operative assessment

## 2020-02-12 NOTE — Anesthesia Postprocedure Evaluation (Signed)
Anesthesia Post Note  Patient: [place]  Procedure(s) Performed: LAPAROSCOPIC CHOLECYSTECTOMY (N/A Abdomen)     Patient location during evaluation: PACU Anesthesia Type: General Level of consciousness: awake and alert Pain management: pain level controlled Vital Signs Assessment: post-procedure vital signs reviewed and stable Respiratory status: spontaneous breathing, nonlabored ventilation, respiratory function stable and patient connected to nasal cannula oxygen Cardiovascular status: blood pressure returned to baseline and stable Postop Assessment: no apparent nausea or vomiting Anesthetic complications: no   No complications documented.  Last Vitals:  Vitals:   02/12/20 0945 02/12/20 1018  BP: (!) 119/58 124/61  Pulse: 93 92  Resp: 13 18  Temp: 36.8 C 36.9 C  SpO2: 93% 90%    Last Pain:  Vitals:   02/12/20 1114  TempSrc:   PainSc: 2                  March Rummage [place]

## 2020-02-12 NOTE — Progress Notes (Signed)
Patient examined in preop holding this morning. Continues to have RUQ pain, which began yesterday morning. She is focally tender in the RUQ on exam. Imaging reviewed and is consistent with acute cholecystitis. Proceed to OR for lap chole. I discussed the details of the procedure and risks including bleeding, infection and <1% risk of common bile duct injury. Plan for overnight observation postop.  Michaelle Birks, MD [facility] Surgery General, Hepatobiliary and Pancreatic Surgery 02/12/20 7:34 AM

## 2020-02-12 NOTE — Progress Notes (Signed)
Patient has arrived to 6N12 @2020 , alert and oriented x4, able to make needs known. C/o 9/10 pain to RUQ, medicated PRN as ordered. Denies nausea/vomiting. Patient was oriented to call light and surroundings. Call bell within reach and Will continue to monitor.

## 2020-02-13 ENCOUNTER — Encounter (HOSPITAL_COMMUNITY): Payer: Self-pay | Admitting: Surgery

## 2020-02-13 LAB — CBC
HCT: 41.6 % (ref 36.0–46.0)
Hemoglobin: 13.3 g/dL (ref 12.0–15.0)
MCH: 29.9 pg (ref 26.0–34.0)
MCHC: 32 g/dL (ref 30.0–36.0)
MCV: 93.5 fL (ref 80.0–100.0)
Platelets: 272 10*3/uL (ref 150–400)
RBC: 4.45 MIL/uL (ref 3.87–5.11)
RDW: 13.2 % (ref 11.5–15.5)
WBC: 24.3 10*3/uL — ABNORMAL HIGH (ref 4.0–10.5)
nRBC: 0 % (ref 0.0–0.2)

## 2020-02-13 MED ORDER — AMOXICILLIN-POT CLAVULANATE 875-125 MG PO TABS
1.0000 | ORAL_TABLET | Freq: Two times a day (BID) | ORAL | Status: DC
  Administered 2020-02-13 – 2020-02-14 (×3): 1 via ORAL
  Filled 2020-02-13 (×3): qty 1

## 2020-02-13 MED ORDER — IBUPROFEN 400 MG PO TABS: 400.0000 mg | ORAL_TABLET | Freq: Four times a day (QID) | ORAL | Status: DC | PRN

## 2020-02-13 NOTE — Discharge Instructions (Signed)
LAPAROSCOPIC SURGERY: POST OP INSTRUCTIONS  ######################################################################  EAT Gradually transition to a high fiber diet with a fiber supplement over the next few weeks after discharge.  Start with a pureed / full liquid diet (see below)  WALK Walk an hour a day.  Control your pain to do that.    CONTROL PAIN Control pain so that you can walk, sleep, tolerate sneezing/coughing, go up/down stairs.  HAVE A BOWEL MOVEMENT DAILY Keep your bowels regular to avoid problems.  OK to try a laxative to override constipation.  OK to use an antidairrheal to slow down diarrhea.  Call if not better after 2 tries  CALL IF YOU HAVE PROBLEMS/CONCERNS Call if you are still struggling despite following these instructions. Call if you have concerns not answered by these instructions  ######################################################################    1. DIET: Follow a light bland diet & liquids the first 24 hours after arrival home, such as soup, liquids, starches, etc.  Be sure to drink plenty of fluids.  Quickly advance to a usual solid diet within a few days.  Avoid fast food or heavy meals as your are more likely to get nauseated or have irregular bowels.  A low-fat, high-fiber diet for the rest of your life is ideal.  2. Take your usually prescribed home medications unless otherwise directed.  3. PAIN CONTROL: a. Pain is best controlled by a usual combination of three different methods TOGETHER: i. Ice/Heat ii. Over the counter pain medication iii. Prescription pain medication b. Most patients will experience some swelling and bruising around the incisions.  Ice packs or heating pads (30-60 minutes up to 6 times a day) will help. Use ice for the first few days to help decrease swelling and bruising, then switch to heat to help relax tight/sore spots and speed recovery.  Some people prefer to use ice alone, heat alone, alternating between ice & heat.   Experiment to what works for you.  Swelling and bruising can take several weeks to resolve.   c. It is helpful to take an over-the-counter pain medication regularly for the first few weeks.  Choose one of the following that works best for you: i. Naproxen (Aleve, etc)  Two 220mg tabs twice a day ii. Ibuprofen (Advil, etc) Three 200mg tabs four times a day (every meal & bedtime) iii. Acetaminophen (Tylenol, etc) 500-650mg four times a day (every meal & bedtime) d. A  prescription for pain medication (such as oxycodone, hydrocodone, tramadol, gabapentin, methocarbamol, etc) should be given to you upon discharge.  Take your pain medication as prescribed.  i. If you are having problems/concerns with the prescription medicine (does not control pain, nausea, vomiting, rash, itching, etc), please call us [phone] to see if we need to switch you to a different pain medicine that will work better for you and/or control your side effect better. ii. If you need a refill on your pain medication, please give us 48 hour notice.  contact your pharmacy.  They will contact our office to request authorization. Prescriptions will not be filled after 5 pm or on week-ends  4. Avoid getting constipated.   a. Between the surgery and the pain medications, it is common to experience some constipation.   b. Increasing fluid intake and taking a fiber supplement (such as Metamucil, Citrucel, FiberCon, MiraLax, etc) 1-2 times a day regularly will usually help prevent this problem from occurring.   c. A mild laxative (prune juice, Milk of Magnesia, MiraLax, etc) should be taken according to   package directions if there are no bowel movements after 48 hours.   5. Watch out for diarrhea.   a. If you have many loose bowel movements, simplify your diet to bland foods & liquids for a few days.   b. Stop any stool softeners and decrease your fiber supplement.   c. Switching to mild anti-diarrheal medications (Kayopectate, Pepto  Bismol) can help.   d. If this worsens or does not improve, please call us.  6. Wash / shower every day.  You may shower over the dressings as they are waterproof.  Continue to shower over incision(s) after the dressing is off.  7. Remove your waterproof bandages 3 days after surgery.  You may leave the incision open to air.  You may replace a dressing/Band-Aid to cover the incision for comfort if you wish.   8. ACTIVITIES as tolerated:   i. You may resume regular (light) daily activities beginning the next day--such as daily self-care, walking, climbing stairs--gradually increasing activities as tolerated.  If you can walk 30 minutes without difficulty, it is safe to try more intense activity such as jogging, treadmill, bicycling, low-impact aerobics, swimming, etc. ii. Save the most intensive and strenuous activity for last such as sit-ups, heavy lifting, contact sports, etc  Refrain from any heavy lifting or straining until you are off narcotics for pain control.   iii. DO NOT PUSH THROUGH PAIN.  Let pain be your guide: If it hurts to do something, don't do it.  Pain is your body warning you to avoid that activity for another week until the pain goes down. iv. You may drive when you are no longer taking prescription pain medication, you can comfortably wear a seatbelt, and you can safely maneuver your car and apply brakes. v. You may have sexual intercourse when it is comfortable.  9. FOLLOW UP in our office a. Please call [facility] at [phone] to set up an appointment to see your surgeon in the office for a follow-up appointment approximately 2-3 weeks after your surgery. b. Make sure that you call for this appointment the day you arrive home to insure a convenient appointment time.  10. IF YOU HAVE DISABILITY OR FAMILY LEAVE FORMS, BRING THEM TO THE OFFICE FOR PROCESSING.  DO NOT GIVE THEM TO YOUR DOCTOR.   WHEN TO CALL US [phone]: 1. Poor pain control 2. Reactions / problems with  new medications (rash/itching, nausea, etc)  3. Fever over 101.5 F (38.5 C) 4. Inability to urinate 5. Nausea and/or vomiting 6. Worsening swelling or bruising 7. Continued bleeding from incision. 8. Increased pain, redness, or drainage from the incision   The clinic staff is available to answer your questions during regular business hours (8:30am-5pm).  Please don't hesitate to call and ask to speak to one of our nurses for clinical concerns.   If you have a medical emergency, go to the nearest emergency room or call 911.  A surgeon from [facility] is always on call at the hospitals   [facility] [address], [address], [place], [place]  [address] ? MAIN: [phone] ? TOLL FREE: [phone] ?  FAX [phone] [email or web address]   

## 2020-02-13 NOTE — Progress Notes (Signed)
1 Day Post-Op    CC: Abdominal pain  Subjective: She looks extremely good.  She is only eating a little bit so far.  She has normal postop abdominal discomfort and her port sites all look fine.  Objective: Vital signs in last 24 hours: Temp:  [98.1 F (36.7 C)-98.8 F (37.1 C)] 98.3 F (36.8 C) (12/13 0452) Pulse Rate:  [80-97] 80 (12/13 0452) Resp:  [13-19] 16 (12/13 0452) BP: (119-130)/(58-73) 129/73 (12/13 0452) SpO2:  [90 %-93 %] 91 % (12/13 0452) Last BM Date: 02/11/20 240 p.o. 2000 IV Urine x1 recorded No BM recorded Afebrile vital signs are stable WBC 24.3  Intake/Output from previous day: 12/12 0701 - 12/13 0700 In: 2285.2 [P.O.:240; I.V.:2045.2] Out: 10 [Blood:10] Intake/Output this shift: No intake/output data recorded.  General appearance: alert, cooperative and no distress Resp: clear to auscultation bilaterally  Abdomen: Soft, sore, port sites all look fine. Lab Results:  Recent Labs    02/12/20 0155 02/13/20 0304  WBC 19.9* 24.3*  HGB 14.5 13.3  HCT 42.4 41.6  PLT 267 272    BMET Recent Labs    02/11/20 0844 02/11/20 2229 02/12/20 0155  NA 140  --  137  K 3.7  --  3.8  CL 105  --  101  CO2 23  --  26  GLUCOSE 149*  --  137*  BUN 10  --  8  CREATININE 0.74 0.67 0.77  CALCIUM 9.6  --  9.3   PT/INR No results for input(s): LABPROT, INR in the last 72 hours.  Recent Labs  Lab 02/11/20 1537 02/12/20 0155  AST 26 34  ALT 23 25  ALKPHOS 70 64  BILITOT 1.0 0.6  PROT 6.8 6.5  ALBUMIN 4.1 3.4*     Lipase     Component Value Date/Time   LIPASE 24 02/11/2020 0844     Medications: . acetaminophen  1,000 mg Oral Q6H  . Chlorhexidine Gluconate Cloth  6 each Topical Daily  . enoxaparin (LOVENOX) injection  40 mg Subcutaneous Q24H  . mupirocin ointment  1 application Nasal BID   . cefTRIAXone (ROCEPHIN)  IV    . lactated ringers 75 mL/hr at 02/13/20 0551    Assessment/Plan Hypertension Hyperlipidemia  Gangrenous  cholecystitis Laparoscopic cholecystectomy 02/12/2020, Dr. Michaelle Birks, POD #1  -WBC 12.9>> 19.2>> 19.9>> 24.3(12/13)  FEN: IV fluids/regular diet ID: Rocephin preop DVT: Lovenox Follow-up: [facility]   Plan: Review with Dr. Grandville Silos, saline lock her IV.  See how she is doing later this afternoon.  Probable discharge later this afternoon.     LOS: 1 day    Hendricks Schwandt 02/13/2020 Please see Amion

## 2020-02-13 NOTE — Plan of Care (Signed)

## 2020-02-14 LAB — CBC WITH DIFFERENTIAL/PLATELET
Abs Immature Granulocytes: 0.04 10*3/uL (ref 0.00–0.07)
Basophils Absolute: 0 10*3/uL (ref 0.0–0.1)
Basophils Relative: 0 %
Eosinophils Absolute: 0.2 10*3/uL (ref 0.0–0.5)
Eosinophils Relative: 2 %
HCT: 35.7 % — ABNORMAL LOW (ref 36.0–46.0)
Hemoglobin: 11.9 g/dL — ABNORMAL LOW (ref 12.0–15.0)
Immature Granulocytes: 0 %
Lymphocytes Relative: 21 %
Lymphs Abs: 2.8 10*3/uL (ref 0.7–4.0)
MCH: 30.7 pg (ref 26.0–34.0)
MCHC: 33.3 g/dL (ref 30.0–36.0)
MCV: 92.2 fL (ref 80.0–100.0)
Monocytes Absolute: 1 10*3/uL (ref 0.1–1.0)
Monocytes Relative: 8 %
Neutro Abs: 9 10*3/uL — ABNORMAL HIGH (ref 1.7–7.7)
Neutrophils Relative %: 69 %
Platelets: 313 10*3/uL (ref 150–400)
RBC: 3.87 MIL/uL (ref 3.87–5.11)
RDW: 13.2 % (ref 11.5–15.5)
WBC: 13 10*3/uL — ABNORMAL HIGH (ref 4.0–10.5)
nRBC: 0 % (ref 0.0–0.2)

## 2020-02-14 LAB — COMPREHENSIVE METABOLIC PANEL
ALT: 29 U/L (ref 0–44)
AST: 26 U/L (ref 15–41)
Albumin: 3 g/dL — ABNORMAL LOW (ref 3.5–5.0)
Alkaline Phosphatase: 73 U/L (ref 38–126)
Anion gap: 8 (ref 5–15)
BUN: 16 mg/dL (ref 8–23)
CO2: 28 mmol/L (ref 22–32)
Calcium: 8.9 mg/dL (ref 8.9–10.3)
Chloride: 104 mmol/L (ref 98–111)
Creatinine, Ser: 0.8 mg/dL (ref 0.44–1.00)
GFR, Estimated: >60 mL/min (ref >60)
Glucose, Bld: 98 mg/dL (ref 70–99)
Potassium: 4 mmol/L (ref 3.5–5.1)
Sodium: 140 mmol/L (ref 135–145)
Total Bilirubin: 0.4 mg/dL (ref 0.3–1.2)
Total Protein: 6 g/dL — ABNORMAL LOW (ref 6.5–8.1)

## 2020-02-14 LAB — SURGICAL PATHOLOGY

## 2020-02-14 MED ORDER — AMOXICILLIN-POT CLAVULANATE 875-125 MG PO TABS: 1.0000 | ORAL_TABLET | Freq: Two times a day (BID) | ORAL | 0 refills | Status: DC

## 2020-02-14 MED ORDER — ACETAMINOPHEN 500 MG PO TABS: 1000.0000 mg | ORAL_TABLET | Freq: Four times a day (QID) | ORAL | 0 refills | Status: AC

## 2020-02-14 NOTE — Discharge Summary (Signed)
Physician Discharge Summary  Patient ID: Debra Mcbride MRN: [id number] DOB/AGE: [birth year] 70 y.o.  Admit date: 02/11/2020 Discharge date: 02/14/2020  Admission Diagnoses:  Acute cholecystitis with cholelithiasis Hx hypertension Hx hyperlipidemia  Discharge Diagnoses:  Gangrenous cholecystitis with cholelithiasis Hx hypertension Hx hyperlipidemia  Active Problems:   Acute cholecystitis   PROCEDURES: Laparoscopic cholecystectomy, 02/12/2020, Dr. Kathrine Haddock Course:  Debra Mcbride is an 69 y.o. female who is here for abdominal pain.  Her pain started this morning and became progressively worse throughout the day.  Is very severe now and only was relieved when she had pain medication.  Is looking in the right upper quadrant and epigastric area. Patient was seen and taken the operating room on 11/13/2019 by Dr. Gilman Schmidt.  She was found to have a gangrenous gallbladder.  She underwent laparoscopic cholecystectomy.  She tolerated the procedure well.  Returned to the floor.  The first postoperative morning she looks quite good and has started eating.  She had not eaten much at that time.  Her WBC had gone up to 24,300. We advanced her diet and started her on Augmentin.  She did well with this and her WBC came down to 13,000 by the a.m. of 02/14/2020.  She looked good, she was only taking Tylenol for pain, she was ready for discharge. Follow-up as listed below.  Condition on discharge: Improved  CBC Latest Ref Rng & Units 02/14/2020 02/13/2020 02/12/2020  WBC 4.0 - 10.5 K/uL 13.0(H) 24.3(H) 19.9(H)  Hemoglobin 12.0 - 15.0 g/dL 11.9(L) 13.3 14.5  Hematocrit 36.0 - 46.0 % 35.7(L) 41.6 42.4  Platelets 150 - 400 K/uL 313 272 267   CMP Latest Ref Rng & Units 02/14/2020 02/12/2020 02/11/2020  Glucose 70 - 99 mg/dL 98 137(H) -  BUN 8 - 23 mg/dL 16 8 -  Creatinine 0.44 - 1.00 mg/dL 0.80 0.77 0.67  Sodium 135 - 145 mmol/L 140 137 -  Potassium 3.5 - 5.1 mmol/L 4.0 3.8 -   Chloride 98 - 111 mmol/L 104 101 -  CO2 22 - 32 mmol/L 28 26 -  Calcium 8.9 - 10.3 mg/dL 8.9 9.3 -  Total Protein 6.5 - 8.1 g/dL 6.0(L) 6.5 -  Total Bilirubin 0.3 - 1.2 mg/dL 0.4 0.6 -  Alkaline Phos 38 - 126 U/L 73 64 -  AST 15 - 41 U/L 26 34 -  ALT 0 - 44 U/L 29 25 -    Right upper quadrant ultrasound 02/11/2020; 1. Sludge and gallstones within the gallbladder with gallbladder wall thickening. No pericholecystic fluid. No biliary ductal dilatation. Negative sonographic Murphy sign. Ultrasound findings are equivocal for acute cholecystitis. Consider HIDA to further evaluate for patency of the cystic and common bile ducts if there is clinical concern for acute cholecystitis. 2. Mild hepatic steatosis.  CT of the abdomen 02/11/2020 1. Cholelithiasis with acute cholecystitis and a likely impacted gallstone within the gallbladder neck. No choledocholithiasis. Recommend surgical consultation. 2. Mild hepatic steatosis. 3. Few scattered colonic diverticula with no acute diverticulitis. 4.  Aortic Atherosclerosis  Pathology is currently pending.    Disposition: Discharge disposition: 01-Home or Self Care        Allergies as of 02/14/2020      Reactions   Crestor [rosuvastatin] Other (See Comments)   Myalgias   Pravachol [pravastatin] Other (See Comments)   Myalgias.   Simvastatin Other (See Comments)   Myalgias.   Sulfa Antibiotics       Medication List    STOP taking these  medications   amoxicillin 500 MG capsule Commonly known as: AMOXIL     TAKE these medications   acetaminophen 500 MG tablet Commonly known as: TYLENOL Take 2 tablets (1,000 mg total) by mouth every 6 (six) hours.   amoxicillin-clavulanate 875-125 MG tablet Commonly known as: AUGMENTIN Take 1 tablet by mouth every 12 (twelve) hours.   colesevelam 625 MG tablet Commonly known as: WELCHOL   diclofenac sodium 1 % Gel Commonly known as: VOLTAREN Apply 2 g topically daily.   diltiazem  120 MG tablet Commonly known as: CARDIZEM Take 120 mg by mouth in the morning and at bedtime.   Fish Oil 1000 MG Caps Take 1,000 mg by mouth in the morning and at bedtime.   gabapentin 100 MG capsule Commonly known as: NEURONTIN Take 100 mg by mouth every other day.   meloxicam 15 MG tablet Commonly known as: MOBIC Take 15 mg by mouth daily.   sertraline 100 MG tablet Commonly known as: ZOLOFT Take 100 mg by mouth daily.   venlafaxine XR 75 MG 24 hr capsule Commonly known as: EFFEXOR-XR Take 75 mg by mouth.       Follow-up Information    [facility] Follow up on 03/06/2020.   Specialty: General Surgery Why: your appointment is at 9:45 AM.  Be at the office 30 minutes early for check in.  Bring photo ID and insurance information.     Your appointment is at   Contact information: [place] [address] [phone]        Kelton Pillar, MD Follow up.   Specialty: Family Medicine Why: call and let them know you had surgery, continue home medicines as directed by Dr. Laurann Montana. Contact information: [address] [place] [address] [phone]               Signed: Earnstine Regal 02/14/2020, 9:44 AM

## 2020-02-14 NOTE — Progress Notes (Signed)
2 Days Post-Op    CC: Abdominal pain  Subjective: She looks good and is tolerating her diet well.  Normal postop soreness.  Port sites all look fine.  Objective: Vital signs in last 24 hours: Temp:  [98.2 F (36.8 C)-98.5 F (36.9 C)] 98.2 F (36.8 C) (12/14 0448) Pulse Rate:  [72-73] 72 (12/14 0448) Resp:  [16] 16 (12/14 0448) BP: (110-138)/(57-81) 138/72 (12/14 0448) SpO2:  [92 %-94 %] 94 % (12/14 0448) Last BM Date: 02/11/20 840 p.o. Voided x5 Afebrile vital signs are stable CMP is normal except for an albumin of 3 and total protein of 6. WBC down to 13.0 Intake/Output from previous day: 12/13 0701 - 12/14 0700 In: 840 [P.O.:840] Out: -  Intake/Output this shift: No intake/output data recorded.  General appearance: alert, cooperative and no distress Resp: clear to auscultation bilaterally GI: Soft, normal postop soreness, port sites all look fine.  Lab Results:  Recent Labs    02/13/20 0304 02/14/20 0059  WBC 24.3* 13.0*  HGB 13.3 11.9*  HCT 41.6 35.7*  PLT 272 313    BMET Recent Labs    02/12/20 0155 02/14/20 0059  NA 137 140  K 3.8 4.0  CL 101 104  CO2 26 28  GLUCOSE 137* 98  BUN 8 16  CREATININE 0.77 0.80  CALCIUM 9.3 8.9   PT/INR No results for input(s): LABPROT, INR in the last 72 hours.  Recent Labs  Lab 02/11/20 1537 02/12/20 0155 02/14/20 0059  AST 26 34 26  ALT 23 25 29   ALKPHOS 70 64 73  BILITOT 1.0 0.6 0.4  PROT 6.8 6.5 6.0*  ALBUMIN 4.1 3.4* 3.0*     Lipase     Component Value Date/Time   LIPASE 24 02/11/2020 0844     Medications: . acetaminophen  1,000 mg Oral Q6H  . amoxicillin-clavulanate  1 tablet Oral Q12H  . Chlorhexidine Gluconate Cloth  6 each Topical Daily  . enoxaparin (LOVENOX) injection  40 mg Subcutaneous Q24H  . mupirocin ointment  1 application Nasal BID    Assessment/Plan Hypertension Hyperlipidemia  Gangrenous cholecystitis Laparoscopic cholecystectomy 02/12/2020, Dr. Michaelle Birks, POD #2   -WBC 12.9>> 19.2>> 19.9>> 24.3(12/13)>> 13.0(12/14)  FEN: IV fluids/regular diet ID: Rocephin preop; Augmentin 12/13>> day 2 DVT: Lovenox Follow-up: [facility] Pain: Tylenol 1 g x 4   Plan: Discharge home today on 4 more days of Augmentin.  Follow-up in our office.  She only wants Tylenol for pain.  LOS: 2 days       Jusitn Salsgiver 02/14/2020 Please see Amion

## 2020-02-14 NOTE — Progress Notes (Signed)
Pt ready for DC accompanied by husband.  Pt understands all follow up instructions and appointments.

## 2020-02-22 DIAGNOSIS — M1712 Unilateral primary osteoarthritis, left knee: Secondary | ICD-10-CM | POA: Diagnosis not present

## 2020-03-30 DIAGNOSIS — M2550 Pain in unspecified joint: Secondary | ICD-10-CM | POA: Diagnosis not present

## 2020-03-30 DIAGNOSIS — Z01818 Encounter for other preprocedural examination: Secondary | ICD-10-CM | POA: Diagnosis not present

## 2020-03-30 DIAGNOSIS — I10 Essential (primary) hypertension: Secondary | ICD-10-CM | POA: Diagnosis not present

## 2020-03-30 DIAGNOSIS — E7800 Pure hypercholesterolemia, unspecified: Secondary | ICD-10-CM | POA: Diagnosis not present

## 2020-03-30 DIAGNOSIS — F39 Unspecified mood [affective] disorder: Secondary | ICD-10-CM | POA: Diagnosis not present

## 2020-05-25 DIAGNOSIS — Z01810 Encounter for preprocedural cardiovascular examination: Secondary | ICD-10-CM | POA: Diagnosis not present

## 2020-06-11 DIAGNOSIS — M1712 Unilateral primary osteoarthritis, left knee: Secondary | ICD-10-CM | POA: Diagnosis not present

## 2020-06-11 DIAGNOSIS — G8918 Other acute postprocedural pain: Secondary | ICD-10-CM | POA: Diagnosis not present

## 2020-06-19 DIAGNOSIS — M25662 Stiffness of left knee, not elsewhere classified: Secondary | ICD-10-CM | POA: Diagnosis not present

## 2020-06-19 DIAGNOSIS — R29898 Other symptoms and signs involving the musculoskeletal system: Secondary | ICD-10-CM | POA: Diagnosis not present

## 2020-06-19 DIAGNOSIS — Z7409 Other reduced mobility: Secondary | ICD-10-CM | POA: Diagnosis not present

## 2020-06-19 DIAGNOSIS — M25462 Effusion, left knee: Secondary | ICD-10-CM | POA: Diagnosis not present

## 2020-06-19 DIAGNOSIS — M25562 Pain in left knee: Secondary | ICD-10-CM | POA: Diagnosis not present

## 2020-06-19 DIAGNOSIS — Z96652 Presence of left artificial knee joint: Secondary | ICD-10-CM | POA: Diagnosis not present

## 2020-06-21 DIAGNOSIS — Z96652 Presence of left artificial knee joint: Secondary | ICD-10-CM | POA: Diagnosis not present

## 2020-06-21 DIAGNOSIS — R29898 Other symptoms and signs involving the musculoskeletal system: Secondary | ICD-10-CM | POA: Diagnosis not present

## 2020-06-21 DIAGNOSIS — Z7409 Other reduced mobility: Secondary | ICD-10-CM | POA: Diagnosis not present

## 2020-06-21 DIAGNOSIS — M25562 Pain in left knee: Secondary | ICD-10-CM | POA: Diagnosis not present

## 2020-06-21 DIAGNOSIS — M25462 Effusion, left knee: Secondary | ICD-10-CM | POA: Diagnosis not present

## 2020-06-21 DIAGNOSIS — M25662 Stiffness of left knee, not elsewhere classified: Secondary | ICD-10-CM | POA: Diagnosis not present

## 2020-06-25 DIAGNOSIS — M25562 Pain in left knee: Secondary | ICD-10-CM | POA: Diagnosis not present

## 2020-06-25 DIAGNOSIS — M25662 Stiffness of left knee, not elsewhere classified: Secondary | ICD-10-CM | POA: Diagnosis not present

## 2020-06-25 DIAGNOSIS — M25462 Effusion, left knee: Secondary | ICD-10-CM | POA: Diagnosis not present

## 2020-06-25 DIAGNOSIS — Z96652 Presence of left artificial knee joint: Secondary | ICD-10-CM | POA: Diagnosis not present

## 2020-06-25 DIAGNOSIS — Z7409 Other reduced mobility: Secondary | ICD-10-CM | POA: Diagnosis not present

## 2020-06-25 DIAGNOSIS — R29898 Other symptoms and signs involving the musculoskeletal system: Secondary | ICD-10-CM | POA: Diagnosis not present

## 2020-06-28 DIAGNOSIS — M25662 Stiffness of left knee, not elsewhere classified: Secondary | ICD-10-CM | POA: Diagnosis not present

## 2020-06-28 DIAGNOSIS — M25462 Effusion, left knee: Secondary | ICD-10-CM | POA: Diagnosis not present

## 2020-06-28 DIAGNOSIS — Z7409 Other reduced mobility: Secondary | ICD-10-CM | POA: Diagnosis not present

## 2020-06-28 DIAGNOSIS — Z96652 Presence of left artificial knee joint: Secondary | ICD-10-CM | POA: Diagnosis not present

## 2020-06-28 DIAGNOSIS — R29898 Other symptoms and signs involving the musculoskeletal system: Secondary | ICD-10-CM | POA: Diagnosis not present

## 2020-06-28 DIAGNOSIS — M25562 Pain in left knee: Secondary | ICD-10-CM | POA: Diagnosis not present

## 2020-07-02 DIAGNOSIS — Z7409 Other reduced mobility: Secondary | ICD-10-CM | POA: Diagnosis not present

## 2020-07-02 DIAGNOSIS — Z96652 Presence of left artificial knee joint: Secondary | ICD-10-CM | POA: Diagnosis not present

## 2020-07-02 DIAGNOSIS — M25562 Pain in left knee: Secondary | ICD-10-CM | POA: Diagnosis not present

## 2020-07-02 DIAGNOSIS — M25462 Effusion, left knee: Secondary | ICD-10-CM | POA: Diagnosis not present

## 2020-07-02 DIAGNOSIS — M25662 Stiffness of left knee, not elsewhere classified: Secondary | ICD-10-CM | POA: Diagnosis not present

## 2020-07-02 DIAGNOSIS — R29898 Other symptoms and signs involving the musculoskeletal system: Secondary | ICD-10-CM | POA: Diagnosis not present

## 2020-07-09 DIAGNOSIS — M25562 Pain in left knee: Secondary | ICD-10-CM | POA: Diagnosis not present

## 2020-07-09 DIAGNOSIS — Z96652 Presence of left artificial knee joint: Secondary | ICD-10-CM | POA: Diagnosis not present

## 2020-07-09 DIAGNOSIS — R29898 Other symptoms and signs involving the musculoskeletal system: Secondary | ICD-10-CM | POA: Diagnosis not present

## 2020-07-09 DIAGNOSIS — M25462 Effusion, left knee: Secondary | ICD-10-CM | POA: Diagnosis not present

## 2020-07-09 DIAGNOSIS — M25662 Stiffness of left knee, not elsewhere classified: Secondary | ICD-10-CM | POA: Diagnosis not present

## 2020-07-09 DIAGNOSIS — Z7409 Other reduced mobility: Secondary | ICD-10-CM | POA: Diagnosis not present

## 2020-07-12 DIAGNOSIS — E7800 Pure hypercholesterolemia, unspecified: Secondary | ICD-10-CM | POA: Diagnosis not present

## 2020-07-12 DIAGNOSIS — F322 Major depressive disorder, single episode, severe without psychotic features: Secondary | ICD-10-CM | POA: Diagnosis not present

## 2020-07-12 DIAGNOSIS — Z7409 Other reduced mobility: Secondary | ICD-10-CM | POA: Diagnosis not present

## 2020-07-12 DIAGNOSIS — M25662 Stiffness of left knee, not elsewhere classified: Secondary | ICD-10-CM | POA: Diagnosis not present

## 2020-07-12 DIAGNOSIS — I10 Essential (primary) hypertension: Secondary | ICD-10-CM | POA: Diagnosis not present

## 2020-07-12 DIAGNOSIS — F39 Unspecified mood [affective] disorder: Secondary | ICD-10-CM | POA: Diagnosis not present

## 2020-07-12 DIAGNOSIS — M25462 Effusion, left knee: Secondary | ICD-10-CM | POA: Diagnosis not present

## 2020-07-12 DIAGNOSIS — N8110 Cystocele, unspecified: Secondary | ICD-10-CM | POA: Diagnosis not present

## 2020-07-12 DIAGNOSIS — M25562 Pain in left knee: Secondary | ICD-10-CM | POA: Diagnosis not present

## 2020-07-12 DIAGNOSIS — Z0000 Encounter for general adult medical examination without abnormal findings: Secondary | ICD-10-CM | POA: Diagnosis not present

## 2020-07-12 DIAGNOSIS — Z96652 Presence of left artificial knee joint: Secondary | ICD-10-CM | POA: Diagnosis not present

## 2020-07-12 DIAGNOSIS — R29898 Other symptoms and signs involving the musculoskeletal system: Secondary | ICD-10-CM | POA: Diagnosis not present

## 2020-07-12 DIAGNOSIS — G4700 Insomnia, unspecified: Secondary | ICD-10-CM | POA: Diagnosis not present

## 2020-07-12 DIAGNOSIS — Z1389 Encounter for screening for other disorder: Secondary | ICD-10-CM | POA: Diagnosis not present

## 2020-08-13 DIAGNOSIS — G4721 Circadian rhythm sleep disorder, delayed sleep phase type: Secondary | ICD-10-CM | POA: Diagnosis not present

## 2020-08-13 DIAGNOSIS — G4700 Insomnia, unspecified: Secondary | ICD-10-CM | POA: Diagnosis not present

## 2020-09-04 DIAGNOSIS — H0015 Chalazion left lower eyelid: Secondary | ICD-10-CM | POA: Diagnosis not present

## 2020-10-18 DIAGNOSIS — R768 Other specified abnormal immunological findings in serum: Secondary | ICD-10-CM | POA: Diagnosis not present

## 2020-10-18 DIAGNOSIS — Z6831 Body mass index (BMI) 31.0-31.9, adult: Secondary | ICD-10-CM | POA: Diagnosis not present

## 2020-10-18 DIAGNOSIS — M542 Cervicalgia: Secondary | ICD-10-CM | POA: Diagnosis not present

## 2020-10-18 DIAGNOSIS — M79671 Pain in right foot: Secondary | ICD-10-CM | POA: Diagnosis not present

## 2020-10-18 DIAGNOSIS — M150 Primary generalized (osteo)arthritis: Secondary | ICD-10-CM | POA: Diagnosis not present

## 2020-10-18 DIAGNOSIS — E669 Obesity, unspecified: Secondary | ICD-10-CM | POA: Diagnosis not present

## 2020-10-18 DIAGNOSIS — M79644 Pain in right finger(s): Secondary | ICD-10-CM | POA: Diagnosis not present

## 2020-12-26 DIAGNOSIS — N8110 Cystocele, unspecified: Secondary | ICD-10-CM | POA: Diagnosis not present

## 2020-12-26 DIAGNOSIS — I10 Essential (primary) hypertension: Secondary | ICD-10-CM | POA: Diagnosis not present

## 2020-12-26 DIAGNOSIS — K580 Irritable bowel syndrome with diarrhea: Secondary | ICD-10-CM | POA: Diagnosis not present

## 2020-12-26 DIAGNOSIS — G4700 Insomnia, unspecified: Secondary | ICD-10-CM | POA: Diagnosis not present

## 2020-12-26 DIAGNOSIS — R1013 Epigastric pain: Secondary | ICD-10-CM | POA: Diagnosis not present

## 2020-12-26 DIAGNOSIS — E7800 Pure hypercholesterolemia, unspecified: Secondary | ICD-10-CM | POA: Diagnosis not present

## 2020-12-26 DIAGNOSIS — Z23 Encounter for immunization: Secondary | ICD-10-CM | POA: Diagnosis not present

## 2021-03-01 ENCOUNTER — Other Ambulatory Visit: Payer: Self-pay

## 2021-03-01 ENCOUNTER — Ambulatory Visit
Admission: EM | Admit: 2021-03-01 | Discharge: 2021-03-01 | Disposition: A | Discharge status: Home / Self Care | Payer: Medicare Other | Attending: Student | Admitting: Student

## 2021-03-01 DIAGNOSIS — L03113 Cellulitis of right upper limb: Secondary | ICD-10-CM | POA: Diagnosis not present

## 2021-03-01 DIAGNOSIS — Z23 Encounter for immunization: Secondary | ICD-10-CM | POA: Diagnosis not present

## 2021-03-01 MED ORDER — AMOXICILLIN-POT CLAVULANATE 875-125 MG PO TABS: 1.0000 | ORAL_TABLET | Freq: Two times a day (BID) | ORAL | 0 refills | Status: DC

## 2021-03-01 MED ORDER — TETANUS-DIPHTH-ACELL PERTUSSIS 5-2.5-18.5 LF-MCG/0.5 IM SUSY
0.5000 mL | PREFILLED_SYRINGE | Freq: Once | INTRAMUSCULAR | Status: AC
  Administered 2021-03-01: 17:00:00 0.5 mL via INTRAMUSCULAR

## 2021-03-01 MED ORDER — CEFTRIAXONE SODIUM 500 MG IJ SOLR
500.0000 mg | Freq: Once | INTRAMUSCULAR | Status: AC
  Administered 2021-03-01: 17:00:00 500 mg via INTRAMUSCULAR

## 2021-03-01 NOTE — ED Triage Notes (Signed)
Pt c/o cat bite to her right wrist. States she took her cat to vet and had to do something rectally at the car in which the cat responded by biting the caregiver/pt. Clear erythema, edema, appears to spread anterior and proximal up arm. Denies discharge. Occurred Wednesday.

## 2021-03-01 NOTE — ED Provider Notes (Signed)
[facility]    CSN: [id number] Arrival date & time: 03/01/21  1444      History   Chief Complaint Chief Complaint  Patient presents with   cat bite right wrist    HPI Debra Mcbride is a 70 y.o. female presenting with cat bite to the right wrist, redness and swelling getting progressively worse.  Medical history hypertension, denies history of diabetes or immunocompromised state. States that she was taking her cat to the vet 2 days ago on 12/28, they attempted to take the cat's temperature rectally and the cat latched onto patient's right wrist. Initially with pain, now with redness and swelling that has progressed even today.  Denies discharge from the area.  Denies sensation changes.  Denies fever/chills, though does endorse fatigue and some malaise.    HPI  Past Medical History:  Diagnosis Date   Complication of anesthesia    High cholesterol    Hypertension    PONV (postoperative nausea and vomiting)    After wisdom teeth removal in the 70's    Patient Active Problem List   Diagnosis Date Noted   Acute cholecystitis 02/11/2020    Past Surgical History:  Procedure Laterality Date   CHOLECYSTECTOMY N/A 02/12/2020   Procedure: LAPAROSCOPIC CHOLECYSTECTOMY;  Surgeon: Dwan Bolt, MD;  Location: [place];  Service: General;  Laterality: N/A;   REPLACEMENT TOTAL KNEE Right    WISDOM TOOTH EXTRACTION      OB History   No obstetric history on file.      Home Medications    Prior to Admission medications   Medication Sig Start Date End Date Taking? Authorizing Provider  amoxicillin-clavulanate (AUGMENTIN) 875-125 MG tablet Take 1 tablet by mouth every 12 (twelve) hours. 03/01/21  Yes Hazel Sams, PA-C  acetaminophen (TYLENOL) 500 MG tablet Take 2 tablets (1,000 mg total) by mouth every 6 (six) hours. 02/14/20   Earnstine Regal, PA-C  colesevelam [facility]) 625 MG tablet  08/08/19   [provider]  diclofenac sodium (VOLTAREN) 1 % GEL Apply  2 g topically daily. 01/29/16   [provider]  diltiazem (CARDIZEM) 120 MG tablet Take 120 mg by mouth in the morning and at bedtime. 01/29/15   [provider]  gabapentin (NEURONTIN) 100 MG capsule Take 100 mg by mouth every other day.    [provider]  meloxicam (MOBIC) 15 MG tablet Take 15 mg by mouth daily. 07/31/19   [provider]  Omega-3 Fatty Acids (FISH OIL) 1000 MG CAPS Take 1,000 mg by mouth in the morning and at bedtime.    [provider]  sertraline (ZOLOFT) 100 MG tablet Take 100 mg by mouth daily. 01/30/20   [provider]    Family History History reviewed. No pertinent family history.  Social History Social History   Tobacco Use   Smoking status: Never   Smokeless tobacco: Never  Vaping Use   Vaping Use: Never used  Substance Use Topics   Alcohol use: Not Currently     Allergies   Crestor [rosuvastatin], Pravachol [pravastatin], Simvastatin, and Sulfa antibiotics   Review of Systems Review of Systems  Skin:  Positive for color change.  All other systems reviewed and are negative.   Physical Exam Triage Vital Signs ED Triage Vitals  Enc Vitals Group     BP 03/01/21 1624 (!) 151/90     Pulse Rate 03/01/21 1624 (!) 105     Resp 03/01/21 1624 20     Temp  03/01/21 1624 99.2 F (37.3 C)     Temp Source 03/01/21 1624 Oral     SpO2 03/01/21 1624 95 %     Weight --      Height --      Head Circumference --      Peak Flow --      Pain Score 03/01/21 1629 0     Pain Loc --      Pain Edu? --      Excl. in [place]? --    No data found.  Updated Vital Signs BP (!) 151/90 (BP Location: Left Arm)    Pulse (!) 105    Temp 99.2 F (37.3 C) (Oral)    Resp 20    SpO2 95%   Visual Acuity Right Eye Distance:   Left Eye Distance:   Bilateral Distance:    Right Eye Near:   Left Eye Near:    Bilateral Near:     Physical Exam Vitals reviewed.  Constitutional:      General: She is not in acute  distress.    Appearance: Normal appearance. She is not ill-appearing or diaphoretic.  HENT:     Head: Normocephalic and atraumatic.  Cardiovascular:     Rate and Rhythm: Regular rhythm. Tachycardia present.     Heart sounds: Normal heart sounds.  Pulmonary:     Effort: Pulmonary effort is normal.     Breath sounds: Normal breath sounds.  Skin:    General: Skin is warm.     Comments: See image below R hand with erythema and edema, worse over dorsal lateral aspect. Erythema extending ventrally to elbow. Some tenderness dorsal wrist at site of bite. ROM flexion and extension wrist intact but with some pain. No discharge expressed. Radial pulse 2+, cap refill <2 seconds.   Neurological:     General: No focal deficit present.     Mental Status: She is alert and oriented to person, place, and time.  Psychiatric:        Mood and Affect: Mood normal.        Behavior: Behavior normal.        Thought Content: Thought content normal.        Judgment: Judgment normal.        UC Treatments / Results  Labs (all labs ordered are listed, but only abnormal results are displayed) Labs Reviewed - No data to display  EKG   Radiology No results found.  Procedures Procedures (including critical care time)  Medications Ordered in UC Medications  Tdap (BOOSTRIX) injection 0.5 mL (0.5 mLs Intramuscular Given 03/01/21 1708)  cefTRIAXone (ROCEPHIN) injection 500 mg (500 mg Intramuscular Given 03/01/21 1708)    Initial Impression / Assessment and Plan / UC Course  I have reviewed the triage vital signs and the nursing notes.  Pertinent labs & imaging results that were available during my care of the patient were reviewed by me and considered in my medical decision making (see chart for details).     This patient is a very pleasant 71 y.o. year old female presenting with cellulitis R wrist/ forearm following cat bite. Afebrile but tachy at 105. The cat is her own and is UTD on shots.  Symptoms x2 days following bite to R wrist, getting progressively worse. See images above. Augmentin sent. Rocephin IM administered. Tdap administered. Wound care discussed. She is not immunocompromised. STRICT ED return precautions discussed. Patient verbalizes understanding and agreement.  Coding Level 4 for acute illness with systemic  symptoms, and prescription drug management .   Final Clinical Impressions(s) / UC Diagnoses   Final diagnoses:  Cellulitis of right upper extremity  Need for Tdap vaccination     Discharge Instructions      -Start the antibiotic-Augmentin (amoxicillin-clavulanate), 1 pill every 12 hours for 7 days.  You can take this with food like with breakfast and dinner. -Mark the redness on your arm with a permanent marker or similar. Keep a close eye on this. After 24 hours of antibiotic, the redness should stop growing. It the redness gets >1 inch bigger after you've been on the antibiotic for 24 hours, head to the ED for possible IV antibiotics. If the redness is rapidly spreading at any time, or new fevers/chills, discharge from the wound, chest pain, shortness of breath - head to the ED immediately.   ED Prescriptions     Medication Sig Dispense Auth. Provider   amoxicillin-clavulanate (AUGMENTIN) 875-125 MG tablet Take 1 tablet by mouth every 12 (twelve) hours. 14 tablet Hazel Sams, PA-C      PDMP not reviewed this encounter.   Hazel Sams, PA-C 03/01/21 1714

## 2021-03-01 NOTE — Discharge Instructions (Addendum)
-  Start the antibiotic-Augmentin (amoxicillin-clavulanate), 1 pill every 12 hours for 7 days.  You can take this with food like with breakfast and dinner. -Mark the redness on your arm with a permanent marker or similar. Keep a close eye on this. After 24 hours of antibiotic, the redness should stop growing. It the redness gets >1 inch bigger after you've been on the antibiotic for 24 hours, head to the ED for possible IV antibiotics. If the redness is rapidly spreading at any time, or new fevers/chills, discharge from the wound, chest pain, shortness of breath - head to the ED immediately.

## 2021-03-20 ENCOUNTER — Ambulatory Visit: Payer: Medicare Other | Admitting: Dermatology

## 2021-04-03 ENCOUNTER — Ambulatory Visit (INDEPENDENT_AMBULATORY_CARE_PROVIDER_SITE_OTHER): Payer: Medicare Other | Admitting: Physician Assistant

## 2021-04-03 ENCOUNTER — Other Ambulatory Visit: Payer: Self-pay

## 2021-04-03 ENCOUNTER — Encounter: Payer: Self-pay | Admitting: Physician Assistant

## 2021-04-03 DIAGNOSIS — L03011 Cellulitis of right finger: Secondary | ICD-10-CM | POA: Diagnosis not present

## 2021-04-03 MED ORDER — CLOTRIMAZOLE 1 % EX CREA: 1.0000 "application " | TOPICAL_CREAM | Freq: Two times a day (BID) | CUTANEOUS | 1 refills | Status: AC

## 2021-04-03 MED ORDER — BETAMETHASONE DIPROPIONATE 0.05 % EX OINT: TOPICAL_OINTMENT | Freq: Three times a day (TID) | CUTANEOUS | 1 refills | Status: AC

## 2021-04-03 NOTE — Progress Notes (Signed)
° °  Follow-Up Visit   Subjective  Debra Mcbride is a 71 y.o. female who presents for the following: Nail Problem (Around right thumb nail x 3 months- comes and goes & stays sore and red. 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 ).   The following portions of the chart were reviewed this encounter and updated as appropriate:  Tobacco   Allergies   Meds   Problems   Med Hx   Surg Hx   Fam Hx       Objective  Well appearing patient in no apparent distress; mood and affect are within normal limits.  A full examination was performed including scalp, head, eyes, ears, nose, lips, neck, chest, axillae, abdomen, back, buttocks, bilateral upper extremities, bilateral lower extremities, hands, feet, fingers, toes, fingernails, and toenails. All findings within normal limits unless otherwise noted below.  Right Thumb Proximal Nail Fold Erythema with yellow discoloration      Assessment & Plan  Paronychia of finger of right hand Right Thumb Proximal Nail Fold  Patient aware goodrx may be the best for coverage for these 2 prescriptions   betamethasone dipropionate (DIPROLENE) 0.05 % ointment - Right Thumb Proximal Nail Fold Apply topically 3 (three) times daily.  clotrimazole (LOTRIMIN) 1 % cream - Right Thumb Proximal Nail Fold Apply 1 application topically 2 (two) times daily.    I, Kinney Sackmann, PA-C, have reviewed all documentation's for this visit.  The documentation on 04/03/21 for the exam, diagnosis, procedures and orders are all accurate and complete.

## 2021-04-03 NOTE — Patient Instructions (Signed)
Please check goodrx for the best price on the 2 prescriptions.

## 2021-04-11 DIAGNOSIS — R150 Incomplete defecation: Secondary | ICD-10-CM | POA: Diagnosis not present

## 2021-04-11 DIAGNOSIS — R197 Diarrhea, unspecified: Secondary | ICD-10-CM | POA: Diagnosis not present

## 2021-04-11 DIAGNOSIS — N8110 Cystocele, unspecified: Secondary | ICD-10-CM | POA: Diagnosis not present

## 2021-04-11 DIAGNOSIS — Z1211 Encounter for screening for malignant neoplasm of colon: Secondary | ICD-10-CM | POA: Diagnosis not present

## 2021-05-07 DIAGNOSIS — R1310 Dysphagia, unspecified: Secondary | ICD-10-CM | POA: Diagnosis not present

## 2021-05-07 DIAGNOSIS — K635 Polyp of colon: Secondary | ICD-10-CM | POA: Diagnosis not present

## 2021-05-07 DIAGNOSIS — D128 Benign neoplasm of rectum: Secondary | ICD-10-CM | POA: Diagnosis not present

## 2021-05-07 DIAGNOSIS — K621 Rectal polyp: Secondary | ICD-10-CM | POA: Diagnosis not present

## 2021-05-07 DIAGNOSIS — D123 Benign neoplasm of transverse colon: Secondary | ICD-10-CM | POA: Diagnosis not present

## 2021-05-07 DIAGNOSIS — Z86010 Personal history of colonic polyps: Secondary | ICD-10-CM | POA: Diagnosis not present

## 2021-05-07 DIAGNOSIS — Z1211 Encounter for screening for malignant neoplasm of colon: Secondary | ICD-10-CM | POA: Diagnosis not present

## 2021-05-28 DIAGNOSIS — N959 Unspecified menopausal and perimenopausal disorder: Secondary | ICD-10-CM | POA: Diagnosis not present

## 2021-05-28 DIAGNOSIS — N816 Rectocele: Secondary | ICD-10-CM | POA: Diagnosis not present

## 2021-05-28 DIAGNOSIS — K5900 Constipation, unspecified: Secondary | ICD-10-CM | POA: Diagnosis not present

## 2021-05-28 DIAGNOSIS — R159 Full incontinence of feces: Secondary | ICD-10-CM | POA: Diagnosis not present

## 2021-05-28 DIAGNOSIS — N3946 Mixed incontinence: Secondary | ICD-10-CM | POA: Diagnosis not present

## 2021-05-28 DIAGNOSIS — N8110 Cystocele, unspecified: Secondary | ICD-10-CM | POA: Diagnosis not present

## 2021-06-18 DIAGNOSIS — Z96653 Presence of artificial knee joint, bilateral: Secondary | ICD-10-CM | POA: Diagnosis not present

## 2021-07-18 DIAGNOSIS — G4721 Circadian rhythm sleep disorder, delayed sleep phase type: Secondary | ICD-10-CM | POA: Diagnosis not present

## 2021-07-18 DIAGNOSIS — M2550 Pain in unspecified joint: Secondary | ICD-10-CM | POA: Diagnosis not present

## 2021-07-18 DIAGNOSIS — F39 Unspecified mood [affective] disorder: Secondary | ICD-10-CM | POA: Diagnosis not present

## 2021-07-18 DIAGNOSIS — Z1331 Encounter for screening for depression: Secondary | ICD-10-CM | POA: Diagnosis not present

## 2021-07-18 DIAGNOSIS — Z0000 Encounter for general adult medical examination without abnormal findings: Secondary | ICD-10-CM | POA: Diagnosis not present

## 2021-07-18 DIAGNOSIS — I10 Essential (primary) hypertension: Secondary | ICD-10-CM | POA: Diagnosis not present

## 2021-07-18 DIAGNOSIS — E7800 Pure hypercholesterolemia, unspecified: Secondary | ICD-10-CM | POA: Diagnosis not present

## 2021-07-18 DIAGNOSIS — I872 Venous insufficiency (chronic) (peripheral): Secondary | ICD-10-CM | POA: Diagnosis not present

## 2021-07-18 DIAGNOSIS — Z23 Encounter for immunization: Secondary | ICD-10-CM | POA: Diagnosis not present

## 2021-07-18 DIAGNOSIS — K219 Gastro-esophageal reflux disease without esophagitis: Secondary | ICD-10-CM | POA: Diagnosis not present

## 2021-07-18 DIAGNOSIS — R233 Spontaneous ecchymoses: Secondary | ICD-10-CM | POA: Diagnosis not present

## 2021-07-18 DIAGNOSIS — N8110 Cystocele, unspecified: Secondary | ICD-10-CM | POA: Diagnosis not present

## 2021-08-30 ENCOUNTER — Ambulatory Visit
Admission: RE | Admit: 2021-08-30 | Discharge: 2021-08-30 | Disposition: A | Discharge status: Home / Self Care | Payer: Medicare Other | Source: Ambulatory Visit | Attending: Internal Medicine | Admitting: Internal Medicine

## 2021-08-30 VITALS — BP 115/60 | HR 85 | Temp 98.7°F | Resp 18

## 2021-08-30 DIAGNOSIS — J069 Acute upper respiratory infection, unspecified: Secondary | ICD-10-CM

## 2021-08-30 MED ORDER — ALBUTEROL SULFATE HFA 108 (90 BASE) MCG/ACT IN AERS: 1.0000 | INHALATION_SPRAY | Freq: Four times a day (QID) | RESPIRATORY_TRACT | 0 refills | Status: DC | PRN

## 2021-08-30 MED ORDER — FLUTICASONE PROPIONATE 50 MCG/ACT NA SUSP: 1.0000 | Freq: Every day | NASAL | 0 refills | Status: AC

## 2021-08-30 MED ORDER — BENZONATATE 100 MG PO CAPS: 100.0000 mg | ORAL_CAPSULE | Freq: Three times a day (TID) | ORAL | 0 refills | Status: DC | PRN

## 2021-08-30 NOTE — Discharge Instructions (Signed)
It appears that you are having a viral illness that should run its course and self resolve with symptomatic treatment.  You have been prescribed 3 medications that should help alleviate symptoms.  Please follow-up sooner if symptoms persist or worsen.

## 2021-08-30 NOTE — ED Provider Notes (Signed)
[facility]    CSN: [id number] Arrival date & time: 08/30/21  1347      History   Chief Complaint Chief Complaint  Patient presents with   ear aches    HPI Debra Mcbride is a 71 y.o. female.   Patient presents with bilateral ear pain, nasal congestion, sinus pressure, cough, wheezing that has been present for approximately 5 days.  Her husband has had similar symptoms recently.  She denies any known sick contacts.  Patient has taken Mucinex with minimal improvement.  Denies history of asthma or COPD and patient is not a smoker.  Denies chest pain, shortness of breath, sore throat, nausea, vomiting, diarrhea, abdominal pain.     Past Medical History:  Diagnosis Date   Complication of anesthesia    High cholesterol    Hypertension    PONV (postoperative nausea and vomiting)    After wisdom teeth removal in the 70's    Patient Active Problem List   Diagnosis Date Noted   Acute cholecystitis 02/11/2020    Past Surgical History:  Procedure Laterality Date   CHOLECYSTECTOMY N/A 02/12/2020   Procedure: LAPAROSCOPIC CHOLECYSTECTOMY;  Surgeon: Dwan Bolt, MD;  Location: [place];  Service: General;  Laterality: N/A;   REPLACEMENT TOTAL KNEE Right    WISDOM TOOTH EXTRACTION      OB History   No obstetric history on file.      Home Medications    Prior to Admission medications   Medication Sig Start Date End Date Taking? Authorizing Provider  albuterol (VENTOLIN HFA) 108 (90 Base) MCG/ACT inhaler Inhale 1-2 puffs into the lungs every 6 (six) hours as needed for wheezing or shortness of breath. 08/30/21  Yes Feleshia Zundel, Hildred Alamin E, FNP  benzonatate (TESSALON) 100 MG capsule Take 1 capsule (100 mg total) by mouth every 8 (eight) hours as needed for cough. 08/30/21  Yes Damante Spragg, Hildred Alamin E, FNP  fluticasone (FLONASE) 50 MCG/ACT nasal spray Place 1 spray into both nostrils daily. 08/30/21  Yes Cosimo Schertzer, Hildred Alamin E, FNP  acetaminophen (TYLENOL) 500 MG tablet Take 2 tablets (1,000  mg total) by mouth every 6 (six) hours. 02/14/20   Earnstine Regal, PA-C  amoxicillin-clavulanate (AUGMENTIN) 875-125 MG tablet Take 1 tablet by mouth every 12 (twelve) hours. 03/01/21   Hazel Sams, PA-C  betamethasone dipropionate (DIPROLENE) 0.05 % ointment Apply topically 3 (three) times daily. 04/03/21   [place], Ronalee Red, PA-C  clotrimazole (LOTRIMIN) 1 % cream Apply 1 application topically 2 (two) times daily. 04/03/21   Warren Danes, PA-C  colesevelam (WELCHOL) 625 MG tablet  08/08/19   [provider]  diclofenac sodium (VOLTAREN) 1 % GEL Apply 2 g topically daily. 01/29/16   [provider]  diltiazem (CARDIZEM) 120 MG tablet Take 120 mg by mouth in the morning and at bedtime. 01/29/15   [provider]  gabapentin (NEURONTIN) 100 MG capsule Take 100 mg by mouth every other day.    [provider]  meloxicam (MOBIC) 15 MG tablet Take 15 mg by mouth daily. 07/31/19   [provider]  Omega-3 Fatty Acids (FISH OIL) 1000 MG CAPS Take 1,000 mg by mouth in the morning and at bedtime.    [provider]  sertraline (ZOLOFT) 100 MG tablet Take 100 mg by mouth daily. 01/30/20   [provider]    Family History History reviewed. No pertinent family history.  Social History Social History   Tobacco Use   Smoking status: Never   Smokeless  tobacco: Never  Vaping Use   Vaping Use: Never used  Substance Use Topics   Alcohol use: Not Currently     Allergies   Crestor [rosuvastatin], Pravachol [pravastatin], Simvastatin, and Sulfa antibiotics   Review of Systems Review of Systems Per HPI  Physical Exam Triage Vital Signs ED Triage Vitals [08/30/21 1406]  Enc Vitals Group     BP 115/60     Pulse Rate 85     Resp 18     Temp 98.7 F (37.1 C)     Temp Source Oral     SpO2 98 %     Weight      Height      Head Circumference      Peak Flow      Pain Score 0     Pain Loc      Pain Edu?      Excl. in [place]?     No data found.  Updated Vital Signs BP 115/60 (BP Location: Left Arm)   Pulse 85   Temp 98.7 F (37.1 C) (Oral)   Resp 18   SpO2 98%   Visual Acuity Right Eye Distance:   Left Eye Distance:   Bilateral Distance:    Right Eye Near:   Left Eye Near:    Bilateral Near:     Physical Exam Constitutional:      General: She is not in acute distress.    Appearance: Normal appearance. She is not toxic-appearing or diaphoretic.  HENT:     Head: Normocephalic and atraumatic.     Right Ear: Tympanic membrane and ear canal normal.     Left Ear: Tympanic membrane and ear canal normal.     Nose: Congestion present.     Mouth/Throat:     Mouth: Mucous membranes are moist.     Pharynx: No posterior oropharyngeal erythema.  Eyes:     Extraocular Movements: Extraocular movements intact.     Conjunctiva/sclera: Conjunctivae normal.     Pupils: Pupils are equal, round, and reactive to light.  Cardiovascular:     Rate and Rhythm: Normal rate and regular rhythm.     Pulses: Normal pulses.     Heart sounds: Normal heart sounds.  Pulmonary:     Effort: Pulmonary effort is normal. No respiratory distress.     Breath sounds: Normal breath sounds. No stridor. No wheezing, rhonchi or rales.  Abdominal:     General: Abdomen is flat. Bowel sounds are normal.     Palpations: Abdomen is soft.  Musculoskeletal:        General: Normal range of motion.     Cervical back: Normal range of motion.  Skin:    General: Skin is warm and dry.  Neurological:     General: No focal deficit present.     Mental Status: She is alert and oriented to person, place, and time. Mental status is at baseline.  Psychiatric:        Mood and Affect: Mood normal.        Behavior: Behavior normal.      UC Treatments / Results  Labs (all labs ordered are listed, but only abnormal results are displayed) Labs Reviewed - No data to display  EKG   Radiology No results found.  Procedures Procedures  (including critical care time)  Medications Ordered in UC Medications - No data to display  Initial Impression / Assessment and Plan / UC Course  I have reviewed the triage vital signs and  the nursing notes.  Pertinent labs & imaging results that were available during my care of the patient were reviewed by me and considered in my medical decision making (see chart for details).     Patient presents with symptoms likely from a viral upper respiratory infection. Differential includes bacterial pneumonia, sinusitis, allergic rhinitis, COVID-19, flu. Do not suspect underlying cardiopulmonary process. Symptoms seem unlikely related to ACS, CHF or COPD exacerbations, pneumonia, pneumothorax. Patient is nontoxic appearing and not in need of emergent medical intervention.  Patient declined COVID testing as she took 2 negative COVID tests at home prior to urgent care visit.  No obvious adventitious lung sounds on exam and no concern for wheezing so do not think that imaging is necessary.  Will treat conservatively with Flonase, benzonatate, albuterol to take as needed.  Return if symptoms fail to improve. Patient states understanding and is agreeable.  Discharged with PCP followup.  Final Clinical Impressions(s) / UC Diagnoses   Final diagnoses:  Viral upper respiratory tract infection with cough     Discharge Instructions      It appears that you are having a viral illness that should run its course and self resolve with symptomatic treatment.  You have been prescribed 3 medications that should help alleviate symptoms.  Please follow-up sooner if symptoms persist or worsen.    ED Prescriptions     Medication Sig Dispense Auth. Provider   fluticasone (FLONASE) 50 MCG/ACT nasal spray Place 1 spray into both nostrils daily. 16 g Levert Heslop, Hildred Alamin E, Rafael Gonzalez   benzonatate (TESSALON) 100 MG capsule Take 1 capsule (100 mg total) by mouth every 8 (eight) hours as needed for cough. 21 capsule [place],  [place] E, [place]   albuterol (VENTOLIN HFA) 108 (90 Base) MCG/ACT inhaler Inhale 1-2 puffs into the lungs every 6 (six) hours as needed for wheezing or shortness of breath. 1 each Teodora Medici, [place]      PDMP not reviewed this encounter.   Teodora Medici, [place] 08/30/21 [phone]

## 2021-08-30 NOTE — ED Triage Notes (Signed)
Pt c/o bilat ear aches, headaches, teeth hurt, wheezing, onset ~ Monday

## 2021-10-02 DIAGNOSIS — H0014 Chalazion left upper eyelid: Secondary | ICD-10-CM | POA: Diagnosis not present

## 2021-10-02 DIAGNOSIS — H04123 Dry eye syndrome of bilateral lacrimal glands: Secondary | ICD-10-CM | POA: Diagnosis not present

## 2021-12-12 DIAGNOSIS — H04122 Dry eye syndrome of left lacrimal gland: Secondary | ICD-10-CM | POA: Diagnosis not present

## 2021-12-12 DIAGNOSIS — H04121 Dry eye syndrome of right lacrimal gland: Secondary | ICD-10-CM | POA: Diagnosis not present

## 2022-01-01 DIAGNOSIS — M85851 Other specified disorders of bone density and structure, right thigh: Secondary | ICD-10-CM | POA: Diagnosis not present

## 2022-01-01 DIAGNOSIS — M85852 Other specified disorders of bone density and structure, left thigh: Secondary | ICD-10-CM | POA: Diagnosis not present

## 2022-01-01 DIAGNOSIS — Z1231 Encounter for screening mammogram for malignant neoplasm of breast: Secondary | ICD-10-CM | POA: Diagnosis not present

## 2022-01-07 DIAGNOSIS — R768 Other specified abnormal immunological findings in serum: Secondary | ICD-10-CM | POA: Diagnosis not present

## 2022-01-07 DIAGNOSIS — M79644 Pain in right finger(s): Secondary | ICD-10-CM | POA: Diagnosis not present

## 2022-01-07 DIAGNOSIS — Z6831 Body mass index (BMI) 31.0-31.9, adult: Secondary | ICD-10-CM | POA: Diagnosis not present

## 2022-01-07 DIAGNOSIS — E669 Obesity, unspecified: Secondary | ICD-10-CM | POA: Diagnosis not present

## 2022-01-07 DIAGNOSIS — M542 Cervicalgia: Secondary | ICD-10-CM | POA: Diagnosis not present

## 2022-01-07 DIAGNOSIS — M1991 Primary osteoarthritis, unspecified site: Secondary | ICD-10-CM | POA: Diagnosis not present

## 2022-01-20 DIAGNOSIS — M150 Primary generalized (osteo)arthritis: Secondary | ICD-10-CM | POA: Diagnosis not present

## 2022-01-20 DIAGNOSIS — E7800 Pure hypercholesterolemia, unspecified: Secondary | ICD-10-CM | POA: Diagnosis not present

## 2022-01-20 DIAGNOSIS — Z23 Encounter for immunization: Secondary | ICD-10-CM | POA: Diagnosis not present

## 2022-01-20 DIAGNOSIS — M8588 Other specified disorders of bone density and structure, other site: Secondary | ICD-10-CM | POA: Diagnosis not present

## 2022-01-20 DIAGNOSIS — Z6833 Body mass index (BMI) 33.0-33.9, adult: Secondary | ICD-10-CM | POA: Diagnosis not present

## 2022-01-20 DIAGNOSIS — E669 Obesity, unspecified: Secondary | ICD-10-CM | POA: Diagnosis not present

## 2022-01-20 DIAGNOSIS — I10 Essential (primary) hypertension: Secondary | ICD-10-CM | POA: Diagnosis not present

## 2022-01-20 DIAGNOSIS — F39 Unspecified mood [affective] disorder: Secondary | ICD-10-CM | POA: Diagnosis not present

## 2022-01-20 DIAGNOSIS — G4700 Insomnia, unspecified: Secondary | ICD-10-CM | POA: Diagnosis not present

## 2022-01-20 DIAGNOSIS — R7301 Impaired fasting glucose: Secondary | ICD-10-CM | POA: Diagnosis not present

## 2022-04-15 DIAGNOSIS — Z135 Encounter for screening for eye and ear disorders: Secondary | ICD-10-CM | POA: Diagnosis not present

## 2022-04-15 DIAGNOSIS — H47239 Glaucomatous optic atrophy, unspecified eye: Secondary | ICD-10-CM | POA: Diagnosis not present

## 2022-04-15 DIAGNOSIS — H259 Unspecified age-related cataract: Secondary | ICD-10-CM | POA: Diagnosis not present

## 2022-04-15 DIAGNOSIS — H524 Presbyopia: Secondary | ICD-10-CM | POA: Diagnosis not present

## 2022-04-15 DIAGNOSIS — H04123 Dry eye syndrome of bilateral lacrimal glands: Secondary | ICD-10-CM | POA: Diagnosis not present

## 2022-04-15 DIAGNOSIS — H52223 Regular astigmatism, bilateral: Secondary | ICD-10-CM | POA: Diagnosis not present

## 2022-04-28 DIAGNOSIS — R682 Dry mouth, unspecified: Secondary | ICD-10-CM | POA: Diagnosis not present

## 2022-07-07 ENCOUNTER — Ambulatory Visit
Admission: RE | Admit: 2022-07-07 | Discharge: 2022-07-07 | Disposition: A | Discharge status: Home / Self Care | Payer: Medicare Other | Source: Ambulatory Visit | Attending: Family Medicine | Admitting: Family Medicine

## 2022-07-07 VITALS — BP 155/69 | HR 83 | Temp 97.8°F | Resp 18

## 2022-07-07 DIAGNOSIS — J069 Acute upper respiratory infection, unspecified: Secondary | ICD-10-CM | POA: Diagnosis not present

## 2022-07-07 MED ORDER — PREDNISONE 20 MG PO TABS: 40.0000 mg | ORAL_TABLET | Freq: Every day | ORAL | 0 refills | Status: AC

## 2022-07-07 MED ORDER — DM-GUAIFENESIN ER 30-600 MG PO TB12: 1.0000 | ORAL_TABLET | Freq: Two times a day (BID) | ORAL | 0 refills | Status: AC

## 2022-07-07 NOTE — ED Provider Notes (Signed)
[facility]    CSN: [id number] Arrival date & time: 07/07/22  1413      History   Chief Complaint Chief Complaint  Patient presents with   Cough    HPI Debra Mcbride is a 72 y.o. female.   Patient is here for 5 days of cough, chest congestion.  Green phlegm, headache.  Some throbbing in her ears.  No fevers/chills.  She used tylenol only. ; No n/v.  Some wheezing.  No sob.  No asthma or copd.  Nonsmoker.        Past Medical History:  Diagnosis Date   Complication of anesthesia    High cholesterol    Hypertension    PONV (postoperative nausea and vomiting)    After wisdom teeth removal in the 70's    Patient Active Problem List   Diagnosis Date Noted   Acute cholecystitis 02/11/2020    Past Surgical History:  Procedure Laterality Date   CHOLECYSTECTOMY N/A 02/12/2020   Procedure: LAPAROSCOPIC CHOLECYSTECTOMY;  Surgeon: Fritzi Mandes, MD;  Location: [facility] OR;  Service: General;  Laterality: N/A;   REPLACEMENT TOTAL KNEE Right    WISDOM TOOTH EXTRACTION      OB History   No obstetric history on file.      Home Medications    Prior to Admission medications   Medication Sig Start Date End Date Taking? Authorizing Provider  acetaminophen (TYLENOL) 500 MG tablet Take 2 tablets (1,000 mg total) by mouth every 6 (six) hours. 02/14/20   Sherrie George, PA-C  albuterol (VENTOLIN HFA) 108 (90 Base) MCG/ACT inhaler Inhale 1-2 puffs into the lungs every 6 (six) hours as needed for wheezing or shortness of breath. 08/30/21   Gustavus Bryant, FNP  amoxicillin-clavulanate (AUGMENTIN) 875-125 MG tablet Take 1 tablet by mouth every 12 (twelve) hours. 03/01/21   Rhys Martini, PA-C  benzonatate (TESSALON) 100 MG capsule Take 1 capsule (100 mg total) by mouth every 8 (eight) hours as needed for cough. 08/30/21   Gustavus Bryant, FNP  betamethasone dipropionate (DIPROLENE) 0.05 % ointment Apply topically 3 (three) times daily. 04/03/21   Sheffield, Judye Bos, PA-C   clotrimazole (LOTRIMIN) 1 % cream Apply 1 application topically 2 (two) times daily. 04/03/21   Glyn Ade, PA-C  colesevelam (WELCHOL) 625 MG tablet  08/08/19   [provider]  diclofenac sodium (VOLTAREN) 1 % GEL Apply 2 g topically daily. 01/29/16   [provider]  diltiazem (CARDIZEM) 120 MG tablet Take 120 mg by mouth in the morning and at bedtime. 01/29/15   [provider]  fluticasone (FLONASE) 50 MCG/ACT nasal spray Place 1 spray into both nostrils daily. 08/30/21   Gustavus Bryant, FNP  gabapentin (NEURONTIN) 100 MG capsule Take 100 mg by mouth every other day.    [provider]  meloxicam (MOBIC) 15 MG tablet Take 15 mg by mouth daily. 07/31/19   [provider]  Omega-3 Fatty Acids (FISH OIL) 1000 MG CAPS Take 1,000 mg by mouth in the morning and at bedtime.    [provider]  sertraline (ZOLOFT) 100 MG tablet Take 100 mg by mouth daily. 01/30/20   [provider]    Family History History reviewed. No pertinent family history.  Social History Social History   Tobacco Use   Smoking status: Never   Smokeless tobacco: Never  Vaping Use   Vaping Use: Never used  Substance Use Topics   Alcohol use: Not Currently  Allergies   Crestor [rosuvastatin], Pravachol [pravastatin], Simvastatin, and Sulfa antibiotics   Review of Systems Review of Systems  Constitutional:  Negative for chills and fever.  HENT:  Positive for rhinorrhea. Negative for congestion.   Respiratory:  Positive for cough and wheezing.   Gastrointestinal: Negative.   Genitourinary: Negative.   Musculoskeletal: Negative.   Psychiatric/Behavioral: Negative.       Physical Exam Triage Vital Signs ED Triage Vitals [07/07/22 1441]  Enc Vitals Group     BP (!) 155/69     Pulse Rate 83     Resp 18     Temp 97.8 F (36.6 C)     Temp Source Oral     SpO2 97 %     Weight      Height      Head Circumference      Peak Flow       Pain Score 5     Pain Loc      Pain Edu?      Excl. in GC?    No data found.  Updated Vital Signs BP (!) 155/69 (BP Location: Right Arm)   Pulse 83   Temp 97.8 F (36.6 C) (Oral)   Resp 18   SpO2 97%   Visual Acuity Right Eye Distance:   Left Eye Distance:   Bilateral Distance:    Right Eye Near:   Left Eye Near:    Bilateral Near:     Physical Exam Constitutional:      Appearance: Normal appearance.  HENT:     Right Ear: A middle ear effusion is present.     Left Ear: A middle ear effusion is present.     Nose: Nose normal. No congestion or rhinorrhea.     Mouth/Throat:     Mouth: Mucous membranes are moist.  Eyes:     Pupils: Pupils are equal, round, and reactive to light.  Cardiovascular:     Rate and Rhythm: Normal rate and regular rhythm.     Pulses: Normal pulses.  Pulmonary:     Effort: Pulmonary effort is normal.     Breath sounds: Normal breath sounds. No wheezing or rhonchi.  Musculoskeletal:     Cervical back: Normal range of motion and neck supple. No rigidity or tenderness.  Skin:    General: Skin is warm.  Neurological:     General: No focal deficit present.     Mental Status: She is alert.  Psychiatric:        Mood and Affect: Mood normal.      UC Treatments / Results  Labs (all labs ordered are listed, but only abnormal results are displayed) Labs Reviewed - No data to display  EKG   Radiology No results found.  Procedures Procedures (including critical care time)  Medications Ordered in UC Medications - No data to display  Initial Impression / Assessment and Plan / UC Course  I have reviewed the triage vital signs and the nursing notes.  Pertinent labs & imaging results that were available during my care of the patient were reviewed by me and considered in my medical decision making (see chart for details).   Final Clinical Impressions(s) / UC Diagnoses   Final diagnoses:  Acute upper respiratory infection      Discharge Instructions      You were diagnosed with a viral upper respiratory infection.  I have sent out a 5 day course of prednisone and mucinex-D to help with symptoms.  Please  continue the over the counter zyrtec as well.  If you develop worsening cough, shortness of breath with fever or chills then please return for further evaluation.      ED Prescriptions     Medication Sig Dispense Auth. Provider   dextromethorphan-guaiFENesin (MUCINEX DM) 30-600 MG 12hr tablet Take 1 tablet by mouth 2 (two) times daily. 30 tablet Raneem Mendolia, MD   predniSONE (DELTASONE) 20 MG tablet Take 2 tablets (40 mg total) by mouth daily for 5 days. 10 tablet Jannifer Franklin, MD      PDMP not reviewed this encounter.   Jannifer Franklin, MD 07/07/22 1452

## 2022-07-07 NOTE — ED Triage Notes (Signed)
Pt c/o chest congestion, cough,   Onset ~ 5 days ago   Reports that her mother passed away last night.

## 2022-07-07 NOTE — Discharge Instructions (Signed)
You were diagnosed with a viral upper respiratory infection.  I have sent out a 5 day course of prednisone and mucinex-D to help with symptoms.  Please continue the over the counter zyrtec as well.  If you develop worsening cough, shortness of breath with fever or chills then please return for further evaluation.

## 2022-07-13 ENCOUNTER — Ambulatory Visit (HOSPITAL_BASED_OUTPATIENT_CLINIC_OR_DEPARTMENT_OTHER)
Admission: RE | Admit: 2022-07-13 | Discharge: 2022-07-13 | Disposition: A | Discharge status: Home / Self Care | Payer: Medicare Other | Source: Ambulatory Visit | Attending: Internal Medicine | Admitting: Internal Medicine

## 2022-07-13 ENCOUNTER — Ambulatory Visit
Admission: RE | Admit: 2022-07-13 | Discharge: 2022-07-13 | Disposition: A | Discharge status: Home / Self Care | Payer: Medicare Other | Source: Ambulatory Visit | Attending: Internal Medicine | Admitting: Internal Medicine

## 2022-07-13 VITALS — BP 148/78 | HR 84 | Temp 98.0°F | Resp 17

## 2022-07-13 DIAGNOSIS — J209 Acute bronchitis, unspecified: Secondary | ICD-10-CM

## 2022-07-13 DIAGNOSIS — R058 Other specified cough: Secondary | ICD-10-CM | POA: Insufficient documentation

## 2022-07-13 DIAGNOSIS — R059 Cough, unspecified: Secondary | ICD-10-CM | POA: Diagnosis not present

## 2022-07-13 MED ORDER — ALBUTEROL SULFATE HFA 108 (90 BASE) MCG/ACT IN AERS: 1.0000 | INHALATION_SPRAY | Freq: Four times a day (QID) | RESPIRATORY_TRACT | 0 refills | Status: AC | PRN

## 2022-07-13 MED ORDER — BENZONATATE 200 MG PO CAPS: 200.0000 mg | ORAL_CAPSULE | Freq: Three times a day (TID) | ORAL | 0 refills | Status: AC | PRN

## 2022-07-13 MED ORDER — DOXYCYCLINE HYCLATE 100 MG PO CAPS: 100.0000 mg | ORAL_CAPSULE | Freq: Two times a day (BID) | ORAL | 0 refills | Status: AC

## 2022-07-13 MED ORDER — PREDNISONE 10 MG (21) PO TBPK: ORAL_TABLET | Freq: Every day | ORAL | 0 refills | Status: AC

## 2022-07-13 NOTE — ED Triage Notes (Signed)
Pt reports a cough, nasal congestion, fluid in her ears and rib cage pain when coughing x 1 week. States she was seen on 5/6 and was given prednisone. States finished medication with no relief/

## 2022-07-13 NOTE — ED Provider Notes (Signed)
Debra Mcbride    CSN: [id number] Arrival date & time: 07/13/22  1350      History   Chief Complaint Chief Complaint  Patient presents with   Cough   Nasal Congestion    HPI Debra Mcbride is a 72 y.o. female previously seen a week ago comes to urgent [facility] for worsening cough, yellowish sputum production and shortness of breath on exertion.  Patient's cough is worsened over the past week and is associated with left-sided chest pain.  No radiation of chest pain.  Patient denies any fever or chills.  No dizziness, near syncope or syncopal episodes.  She has used over-the-counter medications as well as an inhaler given to her by her mother with no improvement in his symptoms.  On arrival in the urgent [facility] patient had normal blood pressure and a pulse oximetry of 95%.Marland Kitchen   HPI  Past Medical History:  Diagnosis Date   Complication of anesthesia    High cholesterol    Hypertension    PONV (postoperative nausea and vomiting)    After wisdom teeth removal in the 70's    Patient Active Problem List   Diagnosis Date Noted   Acute cholecystitis 02/11/2020    Past Surgical History:  Procedure Laterality Date   CHOLECYSTECTOMY N/A 02/12/2020   Procedure: LAPAROSCOPIC CHOLECYSTECTOMY;  Surgeon: Fritzi Mandes, MD;  Location: [facility] OR;  Service: General;  Laterality: N/A;   REPLACEMENT TOTAL KNEE Right    WISDOM TOOTH EXTRACTION      OB History   No obstetric history on file.      Home Medications    Prior to Admission medications   Medication Sig Start Date End Date Taking? Authorizing Provider  albuterol (VENTOLIN HFA) 108 (90 Base) MCG/ACT inhaler Inhale 1-2 puffs into the lungs every 6 (six) hours as needed for wheezing or shortness of breath. 07/13/22  Yes Shanan Mcmiller, Britta Mccreedy, MD  benzonatate (TESSALON) 200 MG capsule Take 1 capsule (200 mg total) by mouth 3 (three) times daily as needed for cough. 07/13/22  Yes Janel Beane, Britta Mccreedy, MD  doxycycline (VIBRAMYCIN) 100 MG  capsule Take 1 capsule (100 mg total) by mouth 2 (two) times daily. 07/13/22  Yes Vinnie Bobst, Britta Mccreedy, MD  predniSONE (STERAPRED UNI-PAK 21 TAB) 10 MG (21) TBPK tablet Take by mouth daily. Take 6 tabs by mouth daily  for 2 days, then 5 tabs for 2 days, then 4 tabs for 2 days, then 3 tabs for 2 days, 2 tabs for 2 days, then 1 tab by mouth daily for 2 days 07/13/22  Yes Joseantonio Dittmar, Britta Mccreedy, MD  acetaminophen (TYLENOL) 500 MG tablet Take 2 tablets (1,000 mg total) by mouth every 6 (six) hours. 02/14/20   Sherrie George, PA-C  betamethasone dipropionate (DIPROLENE) 0.05 % ointment Apply topically 3 (three) times daily. 04/03/21   Sheffield, Judye Bos, PA-C  clotrimazole (LOTRIMIN) 1 % cream Apply 1 application topically 2 (two) times daily. 04/03/21   Glyn Ade, PA-C  colesevelam (WELCHOL) 625 MG tablet  08/08/19   [provider]  dextromethorphan-guaiFENesin (MUCINEX DM) 30-600 MG 12hr tablet Take 1 tablet by mouth 2 (two) times daily. 07/07/22   Piontek, Denny Peon, MD  diclofenac sodium (VOLTAREN) 1 % GEL Apply 2 g topically daily. 01/29/16   [provider]  diltiazem (CARDIZEM) 120 MG tablet Take 120 mg by mouth in the morning and at bedtime. 01/29/15   [provider]  fluticasone (FLONASE) 50 MCG/ACT nasal spray Place 1 spray into  both nostrils daily. 08/30/21   Gustavus Bryant, FNP  gabapentin (NEURONTIN) 100 MG capsule Take 100 mg by mouth every other day.    [provider]  meloxicam (MOBIC) 15 MG tablet Take 15 mg by mouth daily. 07/31/19   [provider]  Omega-3 Fatty Acids (FISH OIL) 1000 MG CAPS Take 1,000 mg by mouth in the morning and at bedtime.    [provider]  sertraline (ZOLOFT) 100 MG tablet Take 100 mg by mouth daily. 01/30/20   [provider]    Family History History reviewed. No pertinent family history.  Social History Social History   Tobacco Use   Smoking status: Never   Smokeless tobacco: Never  Vaping Use    Vaping Use: Never used  Substance Use Topics   Alcohol use: Not Currently     Allergies   Sulfa antibiotics, Crestor [rosuvastatin], Pravachol [pravastatin], and Simvastatin   Review of Systems Review of Systems As per HPI  Physical Exam Triage Vital Signs ED Triage Vitals  Enc Vitals Group     BP 07/13/22 1407 (!) 148/78     Pulse Rate 07/13/22 1407 84     Resp 07/13/22 1407 17     Temp 07/13/22 1407 98 F (36.7 C)     Temp Source 07/13/22 1407 Oral     SpO2 07/13/22 1407 95 %     Weight --      Height --      Head Circumference --      Peak Flow --      Pain Score 07/13/22 1408 4     Pain Loc --      Pain Edu? --      Excl. in GC? --    No data found.  Updated Vital Signs BP (!) 148/78 (BP Location: Left Arm)   Pulse 84   Temp 98 F (36.7 C) (Oral)   Resp 17   SpO2 95%   Visual Acuity Right Eye Distance:   Left Eye Distance:   Bilateral Distance:    Right Eye Near:   Left Eye Near:    Bilateral Near:     Physical Exam Vitals and nursing note reviewed.  Constitutional:      General: She is not in acute distress.    Appearance: She is not ill-appearing.  HENT:     Right Ear: Tympanic membrane normal.     Left Ear: Tympanic membrane normal.  Cardiovascular:     Rate and Rhythm: Normal rate and regular rhythm.     Pulses: Normal pulses.     Heart sounds: Normal heart sounds.  Pulmonary:     Effort: Pulmonary effort is normal.     Breath sounds: Wheezing and rhonchi present.  Abdominal:     General: Bowel sounds are normal.     Palpations: Abdomen is soft.  Neurological:     Mental Status: She is alert.      UC Treatments / Results  Labs (all labs ordered are listed, but only abnormal results are displayed) Labs Reviewed - No data to display  EKG   Radiology No results found.  Procedures Procedures (including critical [facility] time)  Medications Ordered in UC Medications - No data to display  Initial Impression / Assessment and  Plan / UC Course  I have reviewed the triage vital signs and the nursing notes.  Pertinent labs & imaging results that were available during my [facility] of the patient were reviewed by me and  considered in my medical decision making (see chart for details).     1.  Acute bronchitis with bronchospasm: Chest x-ray Albuterol inhaler Tapering dose of prednisone Tessalon Perles as needed for cough Doxycycline 100 mg twice daily for 5 days Return precautions given Adequate hydration recommended. Final Clinical Impressions(s) / UC Diagnoses   Final diagnoses:  Acute bronchitis with bronchospasm     Discharge Instructions      Please maintain adequate hydration I will like you to get the chest x-ray at [facility]. Please take medications as directed Tylenol/Motrin as needed for pain and/or fever We will call you with recommendations if labs are abnormal Return to urgent [facility] if symptoms worsen.      ED Prescriptions     Medication Sig Dispense Auth. Provider   predniSONE (STERAPRED UNI-PAK 21 TAB) 10 MG (21) TBPK tablet Take by mouth daily. Take 6 tabs by mouth daily  for 2 days, then 5 tabs for 2 days, then 4 tabs for 2 days, then 3 tabs for 2 days, 2 tabs for 2 days, then 1 tab by mouth daily for 2 days 42 tablet Haedyn Ancrum, Britta Mccreedy, MD   albuterol (VENTOLIN HFA) 108 (90 Base) MCG/ACT inhaler Inhale 1-2 puffs into the lungs every 6 (six) hours as needed for wheezing or shortness of breath. 6.7 g Antonino Nienhuis, Britta Mccreedy, MD   doxycycline (VIBRAMYCIN) 100 MG capsule Take 1 capsule (100 mg total) by mouth 2 (two) times daily. 20 capsule Symphonie Schneiderman, Britta Mccreedy, MD   benzonatate (TESSALON) 200 MG capsule Take 1 capsule (200 mg total) by mouth 3 (three) times daily as needed for cough. 30 capsule Mckell Riecke, Britta Mccreedy, MD      PDMP not reviewed this encounter.   Merrilee Jansky, MD 07/13/22 1550

## 2022-07-13 NOTE — Discharge Instructions (Addendum)
Please maintain adequate hydration I will like you to get the chest x-ray at the [facility]. Please take medications as directed Tylenol/Motrin as needed for pain and/or fever We will call you with recommendations if labs are abnormal Return to urgent care if symptoms worsen.

## 2022-07-21 DIAGNOSIS — R7303 Prediabetes: Secondary | ICD-10-CM | POA: Diagnosis not present

## 2022-07-21 DIAGNOSIS — K219 Gastro-esophageal reflux disease without esophagitis: Secondary | ICD-10-CM | POA: Diagnosis not present

## 2022-07-21 DIAGNOSIS — Z0000 Encounter for general adult medical examination without abnormal findings: Secondary | ICD-10-CM | POA: Diagnosis not present

## 2022-07-21 DIAGNOSIS — M150 Primary generalized (osteo)arthritis: Secondary | ICD-10-CM | POA: Diagnosis not present

## 2022-07-21 DIAGNOSIS — I872 Venous insufficiency (chronic) (peripheral): Secondary | ICD-10-CM | POA: Diagnosis not present

## 2022-07-21 DIAGNOSIS — I10 Essential (primary) hypertension: Secondary | ICD-10-CM | POA: Diagnosis not present

## 2022-07-21 DIAGNOSIS — N816 Rectocele: Secondary | ICD-10-CM | POA: Diagnosis not present

## 2022-07-21 DIAGNOSIS — E7800 Pure hypercholesterolemia, unspecified: Secondary | ICD-10-CM | POA: Diagnosis not present

## 2022-07-21 DIAGNOSIS — G4700 Insomnia, unspecified: Secondary | ICD-10-CM | POA: Diagnosis not present

## 2022-07-21 DIAGNOSIS — F39 Unspecified mood [affective] disorder: Secondary | ICD-10-CM | POA: Diagnosis not present

## 2022-07-21 DIAGNOSIS — M8588 Other specified disorders of bone density and structure, other site: Secondary | ICD-10-CM | POA: Diagnosis not present

## 2022-07-21 DIAGNOSIS — G720 Drug-induced myopathy: Secondary | ICD-10-CM | POA: Diagnosis not present

## 2022-09-05 DIAGNOSIS — H40013 Open angle with borderline findings, low risk, bilateral: Secondary | ICD-10-CM | POA: Diagnosis not present

## 2022-09-05 DIAGNOSIS — H2513 Age-related nuclear cataract, bilateral: Secondary | ICD-10-CM | POA: Diagnosis not present

## 2022-09-23 DIAGNOSIS — H25811 Combined forms of age-related cataract, right eye: Secondary | ICD-10-CM | POA: Diagnosis not present

## 2022-09-23 DIAGNOSIS — H25812 Combined forms of age-related cataract, left eye: Secondary | ICD-10-CM | POA: Diagnosis not present

## 2022-09-23 DIAGNOSIS — Z01818 Encounter for other preprocedural examination: Secondary | ICD-10-CM | POA: Diagnosis not present

## 2022-10-20 DIAGNOSIS — H25811 Combined forms of age-related cataract, right eye: Secondary | ICD-10-CM | POA: Diagnosis not present

## 2022-11-10 DIAGNOSIS — H25812 Combined forms of age-related cataract, left eye: Secondary | ICD-10-CM | POA: Diagnosis not present

## 2022-12-05 ENCOUNTER — Ambulatory Visit: Payer: Self-pay

## 2022-12-05 DIAGNOSIS — R300 Dysuria: Secondary | ICD-10-CM | POA: Diagnosis not present

## 2022-12-05 DIAGNOSIS — Z23 Encounter for immunization: Secondary | ICD-10-CM | POA: Diagnosis not present

## 2023-01-01 DIAGNOSIS — L821 Other seborrheic keratosis: Secondary | ICD-10-CM | POA: Diagnosis not present

## 2023-01-01 DIAGNOSIS — L720 Epidermal cyst: Secondary | ICD-10-CM | POA: Diagnosis not present

## 2023-01-01 DIAGNOSIS — L814 Other melanin hyperpigmentation: Secondary | ICD-10-CM | POA: Diagnosis not present

## 2023-01-01 DIAGNOSIS — L988 Other specified disorders of the skin and subcutaneous tissue: Secondary | ICD-10-CM | POA: Diagnosis not present

## 2023-01-01 DIAGNOSIS — L718 Other rosacea: Secondary | ICD-10-CM | POA: Diagnosis not present

## 2023-01-01 DIAGNOSIS — C44529 Squamous cell carcinoma of skin of other part of trunk: Secondary | ICD-10-CM | POA: Diagnosis not present

## 2023-01-01 DIAGNOSIS — D485 Neoplasm of uncertain behavior of skin: Secondary | ICD-10-CM | POA: Diagnosis not present

## 2023-01-07 DIAGNOSIS — L570 Actinic keratosis: Secondary | ICD-10-CM | POA: Diagnosis not present

## 2023-01-07 DIAGNOSIS — L820 Inflamed seborrheic keratosis: Secondary | ICD-10-CM | POA: Diagnosis not present

## 2023-01-07 DIAGNOSIS — L233 Allergic contact dermatitis due to drugs in contact with skin: Secondary | ICD-10-CM | POA: Diagnosis not present

## 2023-01-07 DIAGNOSIS — L821 Other seborrheic keratosis: Secondary | ICD-10-CM | POA: Diagnosis not present

## 2023-01-07 DIAGNOSIS — C44529 Squamous cell carcinoma of skin of other part of trunk: Secondary | ICD-10-CM | POA: Diagnosis not present

## 2023-01-07 DIAGNOSIS — L538 Other specified erythematous conditions: Secondary | ICD-10-CM | POA: Diagnosis not present

## 2023-01-07 DIAGNOSIS — Z1231 Encounter for screening mammogram for malignant neoplasm of breast: Secondary | ICD-10-CM | POA: Diagnosis not present

## 2023-01-20 DIAGNOSIS — E7800 Pure hypercholesterolemia, unspecified: Secondary | ICD-10-CM | POA: Diagnosis not present

## 2023-01-20 DIAGNOSIS — M8588 Other specified disorders of bone density and structure, other site: Secondary | ICD-10-CM | POA: Diagnosis not present

## 2023-01-20 DIAGNOSIS — I10 Essential (primary) hypertension: Secondary | ICD-10-CM | POA: Diagnosis not present

## 2023-01-20 DIAGNOSIS — Z6832 Body mass index (BMI) 32.0-32.9, adult: Secondary | ICD-10-CM | POA: Diagnosis not present

## 2023-01-20 DIAGNOSIS — M150 Primary generalized (osteo)arthritis: Secondary | ICD-10-CM | POA: Diagnosis not present

## 2023-01-20 DIAGNOSIS — K219 Gastro-esophageal reflux disease without esophagitis: Secondary | ICD-10-CM | POA: Diagnosis not present

## 2023-01-20 DIAGNOSIS — F39 Unspecified mood [affective] disorder: Secondary | ICD-10-CM | POA: Diagnosis not present

## 2023-01-20 DIAGNOSIS — G4700 Insomnia, unspecified: Secondary | ICD-10-CM | POA: Diagnosis not present

## 2023-01-20 DIAGNOSIS — E669 Obesity, unspecified: Secondary | ICD-10-CM | POA: Diagnosis not present

## 2023-01-20 DIAGNOSIS — R7303 Prediabetes: Secondary | ICD-10-CM | POA: Diagnosis not present

## 2023-02-17 DIAGNOSIS — L821 Other seborrheic keratosis: Secondary | ICD-10-CM | POA: Diagnosis not present

## 2023-02-17 DIAGNOSIS — L910 Hypertrophic scar: Secondary | ICD-10-CM | POA: Diagnosis not present

## 2023-02-17 DIAGNOSIS — D492 Neoplasm of unspecified behavior of bone, soft tissue, and skin: Secondary | ICD-10-CM | POA: Diagnosis not present

## 2023-02-17 DIAGNOSIS — L820 Inflamed seborrheic keratosis: Secondary | ICD-10-CM | POA: Diagnosis not present

## 2023-05-07 ENCOUNTER — Ambulatory Visit (INDEPENDENT_AMBULATORY_CARE_PROVIDER_SITE_OTHER)

## 2023-05-07 ENCOUNTER — Ambulatory Visit (INDEPENDENT_AMBULATORY_CARE_PROVIDER_SITE_OTHER): Payer: Self-pay | Admitting: Podiatry

## 2023-05-07 ENCOUNTER — Encounter: Payer: Self-pay | Admitting: Podiatry

## 2023-05-07 DIAGNOSIS — Q828 Other specified congenital malformations of skin: Secondary | ICD-10-CM | POA: Diagnosis not present

## 2023-05-07 DIAGNOSIS — M7671 Peroneal tendinitis, right leg: Secondary | ICD-10-CM

## 2023-05-07 DIAGNOSIS — M722 Plantar fascial fibromatosis: Secondary | ICD-10-CM | POA: Diagnosis not present

## 2023-05-07 MED ORDER — TRIAMCINOLONE ACETONIDE 10 MG/ML IJ SUSP
10.0000 mg | Freq: Once | INTRAMUSCULAR | Status: AC
  Administered 2023-05-07: 10 mg via INTRA_ARTICULAR

## 2023-05-07 NOTE — Progress Notes (Signed)
 Subjective:   Patient ID: Debra Mcbride, female   DOB: 73 y.o.   MRN: [id number]   HPI Patient states she has developed a lot of pain on the outside of the right foot with inflammation and she has a lesion underneath the left fifth metatarsal that can be bothersome.  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  Patient does not smoke likes to be active   Review of Systems  All other systems reviewed and are negative.       Objective:  Physical Exam Vitals and nursing note reviewed.  Constitutional:      Appearance: She is well-developed.  Pulmonary:     Effort: Pulmonary effort is normal.  Musculoskeletal:        General: Normal range of motion.  Skin:    General: Skin is warm.  Neurological:     Mental Status: She is alert.     Neurovascular status intact muscle strength adequate range of motion adequate with inflammation base of fifth metatarsal right with fluid buildup around the insertional point and has a prominence of the fifth metatarsal head left with keratotic lesion plantar that is moderately tender.  Good digital perfusion well oriented x 3     Assessment:  Inflammatory peroneal tendinitis right with fluid buildup and keratotic lesion left with tailor's bunion deformity     Plan:  H&P reviewed both conditions.  For the right I did discuss arthritis and I did discuss inflammation of the tendon and did careful injection of the tendon at insertion after explaining risk 3 mg Dexasone Kenalog 5 mg Xylocaine and for the left I did courtesy sharp debridement of lesion and applied sterile dressing.  Patient will be seen back as needed  X-rays right do indicate quite a bit of midfoot arthritis right no indications of pathology or bone structure issues base of fifth metatarsal

## 2023-05-26 DIAGNOSIS — N816 Rectocele: Secondary | ICD-10-CM | POA: Diagnosis not present

## 2023-05-26 DIAGNOSIS — R159 Full incontinence of feces: Secondary | ICD-10-CM | POA: Diagnosis not present

## 2023-06-05 DIAGNOSIS — L910 Hypertrophic scar: Secondary | ICD-10-CM | POA: Diagnosis not present

## 2023-06-05 DIAGNOSIS — L578 Other skin changes due to chronic exposure to nonionizing radiation: Secondary | ICD-10-CM | POA: Diagnosis not present

## 2023-06-05 DIAGNOSIS — Z08 Encounter for follow-up examination after completed treatment for malignant neoplasm: Secondary | ICD-10-CM | POA: Diagnosis not present

## 2023-06-05 DIAGNOSIS — L814 Other melanin hyperpigmentation: Secondary | ICD-10-CM | POA: Diagnosis not present

## 2023-06-05 DIAGNOSIS — Z85828 Personal history of other malignant neoplasm of skin: Secondary | ICD-10-CM | POA: Diagnosis not present

## 2023-07-17 DIAGNOSIS — R159 Full incontinence of feces: Secondary | ICD-10-CM | POA: Diagnosis not present

## 2023-07-17 DIAGNOSIS — N816 Rectocele: Secondary | ICD-10-CM | POA: Diagnosis not present

## 2023-07-20 DIAGNOSIS — N3946 Mixed incontinence: Secondary | ICD-10-CM | POA: Diagnosis not present

## 2023-07-24 DIAGNOSIS — M8588 Other specified disorders of bone density and structure, other site: Secondary | ICD-10-CM | POA: Diagnosis not present

## 2023-07-24 DIAGNOSIS — I10 Essential (primary) hypertension: Secondary | ICD-10-CM | POA: Diagnosis not present

## 2023-07-24 DIAGNOSIS — I872 Venous insufficiency (chronic) (peripheral): Secondary | ICD-10-CM | POA: Diagnosis not present

## 2023-07-24 DIAGNOSIS — G4700 Insomnia, unspecified: Secondary | ICD-10-CM | POA: Diagnosis not present

## 2023-07-24 DIAGNOSIS — R7303 Prediabetes: Secondary | ICD-10-CM | POA: Diagnosis not present

## 2023-07-24 DIAGNOSIS — R2689 Other abnormalities of gait and mobility: Secondary | ICD-10-CM | POA: Diagnosis not present

## 2023-07-24 DIAGNOSIS — M79602 Pain in left arm: Secondary | ICD-10-CM | POA: Diagnosis not present

## 2023-07-24 DIAGNOSIS — M150 Primary generalized (osteo)arthritis: Secondary | ICD-10-CM | POA: Diagnosis not present

## 2023-07-24 DIAGNOSIS — Z0000 Encounter for general adult medical examination without abnormal findings: Secondary | ICD-10-CM | POA: Diagnosis not present

## 2023-07-24 DIAGNOSIS — E7800 Pure hypercholesterolemia, unspecified: Secondary | ICD-10-CM | POA: Diagnosis not present

## 2023-07-24 DIAGNOSIS — F39 Unspecified mood [affective] disorder: Secondary | ICD-10-CM | POA: Diagnosis not present

## 2023-08-03 DIAGNOSIS — N993 Prolapse of vaginal vault after hysterectomy: Secondary | ICD-10-CM | POA: Diagnosis not present

## 2023-08-03 DIAGNOSIS — R82998 Other abnormal findings in urine: Secondary | ICD-10-CM | POA: Diagnosis not present

## 2023-08-05 DIAGNOSIS — H02422 Myogenic ptosis of left eyelid: Secondary | ICD-10-CM | POA: Diagnosis not present

## 2023-08-05 DIAGNOSIS — H02413 Mechanical ptosis of bilateral eyelids: Secondary | ICD-10-CM | POA: Diagnosis not present

## 2023-08-05 DIAGNOSIS — H02831 Dermatochalasis of right upper eyelid: Secondary | ICD-10-CM | POA: Diagnosis not present

## 2023-08-05 DIAGNOSIS — H0014 Chalazion left upper eyelid: Secondary | ICD-10-CM | POA: Diagnosis not present

## 2023-08-05 DIAGNOSIS — H02834 Dermatochalasis of left upper eyelid: Secondary | ICD-10-CM | POA: Diagnosis not present

## 2023-08-05 DIAGNOSIS — H02421 Myogenic ptosis of right eyelid: Secondary | ICD-10-CM | POA: Diagnosis not present

## 2023-08-05 DIAGNOSIS — H02423 Myogenic ptosis of bilateral eyelids: Secondary | ICD-10-CM | POA: Diagnosis not present

## 2023-08-05 DIAGNOSIS — H02411 Mechanical ptosis of right eyelid: Secondary | ICD-10-CM | POA: Diagnosis not present

## 2023-08-05 DIAGNOSIS — H00014 Hordeolum externum left upper eyelid: Secondary | ICD-10-CM | POA: Diagnosis not present

## 2023-08-05 DIAGNOSIS — H0279 Other degenerative disorders of eyelid and periocular area: Secondary | ICD-10-CM | POA: Diagnosis not present

## 2023-08-05 DIAGNOSIS — H02412 Mechanical ptosis of left eyelid: Secondary | ICD-10-CM | POA: Diagnosis not present

## 2023-08-05 DIAGNOSIS — H57813 Brow ptosis, bilateral: Secondary | ICD-10-CM | POA: Diagnosis not present

## 2023-08-12 DIAGNOSIS — H40013 Open angle with borderline findings, low risk, bilateral: Secondary | ICD-10-CM | POA: Diagnosis not present

## 2023-09-07 DIAGNOSIS — H53483 Generalized contraction of visual field, bilateral: Secondary | ICD-10-CM | POA: Diagnosis not present

## 2023-10-02 DIAGNOSIS — N993 Prolapse of vaginal vault after hysterectomy: Secondary | ICD-10-CM | POA: Diagnosis not present

## 2023-10-02 DIAGNOSIS — R82998 Other abnormal findings in urine: Secondary | ICD-10-CM | POA: Diagnosis not present

## 2023-10-02 DIAGNOSIS — N3946 Mixed incontinence: Secondary | ICD-10-CM | POA: Diagnosis not present

## 2023-10-09 DIAGNOSIS — K219 Gastro-esophageal reflux disease without esophagitis: Secondary | ICD-10-CM | POA: Diagnosis not present

## 2023-10-09 DIAGNOSIS — N393 Stress incontinence (female) (male): Secondary | ICD-10-CM | POA: Diagnosis not present

## 2023-10-09 DIAGNOSIS — M86641 Other chronic osteomyelitis, right hand: Secondary | ICD-10-CM | POA: Diagnosis not present

## 2023-10-09 DIAGNOSIS — N993 Prolapse of vaginal vault after hysterectomy: Secondary | ICD-10-CM | POA: Diagnosis not present

## 2023-10-09 DIAGNOSIS — E785 Hyperlipidemia, unspecified: Secondary | ICD-10-CM | POA: Diagnosis not present

## 2023-10-09 DIAGNOSIS — N814 Uterovaginal prolapse, unspecified: Secondary | ICD-10-CM | POA: Diagnosis not present

## 2023-10-09 DIAGNOSIS — K6281 Anal sphincter tear (healed) (nontraumatic) (old): Secondary | ICD-10-CM | POA: Diagnosis not present

## 2023-10-09 DIAGNOSIS — I10 Essential (primary) hypertension: Secondary | ICD-10-CM | POA: Diagnosis not present

## 2023-10-22 DIAGNOSIS — Z48816 Encounter for surgical aftercare following surgery on the genitourinary system: Secondary | ICD-10-CM | POA: Diagnosis not present

## 2023-10-22 DIAGNOSIS — N3946 Mixed incontinence: Secondary | ICD-10-CM | POA: Diagnosis not present

## 2023-11-18 DIAGNOSIS — N993 Prolapse of vaginal vault after hysterectomy: Secondary | ICD-10-CM | POA: Diagnosis not present

## 2023-11-18 DIAGNOSIS — N3946 Mixed incontinence: Secondary | ICD-10-CM | POA: Diagnosis not present

## 2023-12-28 DIAGNOSIS — M79621 Pain in right upper arm: Secondary | ICD-10-CM | POA: Diagnosis not present

## 2024-01-05 DIAGNOSIS — M25511 Pain in right shoulder: Secondary | ICD-10-CM | POA: Diagnosis not present

## 2024-01-05 DIAGNOSIS — M19011 Primary osteoarthritis, right shoulder: Secondary | ICD-10-CM | POA: Diagnosis not present

## 2024-01-11 DIAGNOSIS — H02421 Myogenic ptosis of right eyelid: Secondary | ICD-10-CM | POA: Diagnosis not present

## 2024-01-11 DIAGNOSIS — H0279 Other degenerative disorders of eyelid and periocular area: Secondary | ICD-10-CM | POA: Diagnosis not present

## 2024-01-11 DIAGNOSIS — H02423 Myogenic ptosis of bilateral eyelids: Secondary | ICD-10-CM | POA: Diagnosis not present

## 2024-01-11 DIAGNOSIS — H02412 Mechanical ptosis of left eyelid: Secondary | ICD-10-CM | POA: Diagnosis not present

## 2024-01-11 DIAGNOSIS — H57813 Brow ptosis, bilateral: Secondary | ICD-10-CM | POA: Diagnosis not present

## 2024-01-11 DIAGNOSIS — H02831 Dermatochalasis of right upper eyelid: Secondary | ICD-10-CM | POA: Diagnosis not present

## 2024-01-11 DIAGNOSIS — H02413 Mechanical ptosis of bilateral eyelids: Secondary | ICD-10-CM | POA: Diagnosis not present

## 2024-01-11 DIAGNOSIS — H00014 Hordeolum externum left upper eyelid: Secondary | ICD-10-CM | POA: Diagnosis not present

## 2024-01-11 DIAGNOSIS — H0014 Chalazion left upper eyelid: Secondary | ICD-10-CM | POA: Diagnosis not present

## 2024-01-11 DIAGNOSIS — H53453 Other localized visual field defect, bilateral: Secondary | ICD-10-CM | POA: Diagnosis not present

## 2024-01-11 DIAGNOSIS — H02411 Mechanical ptosis of right eyelid: Secondary | ICD-10-CM | POA: Diagnosis not present

## 2024-01-11 DIAGNOSIS — H02834 Dermatochalasis of left upper eyelid: Secondary | ICD-10-CM | POA: Diagnosis not present

## 2024-01-11 DIAGNOSIS — H02422 Myogenic ptosis of left eyelid: Secondary | ICD-10-CM | POA: Diagnosis not present

## 2024-01-14 DIAGNOSIS — M25511 Pain in right shoulder: Secondary | ICD-10-CM | POA: Diagnosis not present

## 2024-01-19 DIAGNOSIS — M7541 Impingement syndrome of right shoulder: Secondary | ICD-10-CM | POA: Diagnosis not present

## 2024-01-22 DIAGNOSIS — Z1231 Encounter for screening mammogram for malignant neoplasm of breast: Secondary | ICD-10-CM | POA: Diagnosis not present

## 2024-01-26 DIAGNOSIS — I10 Essential (primary) hypertension: Secondary | ICD-10-CM | POA: Diagnosis not present

## 2024-01-26 DIAGNOSIS — G720 Drug-induced myopathy: Secondary | ICD-10-CM | POA: Diagnosis not present

## 2024-01-26 DIAGNOSIS — R5383 Other fatigue: Secondary | ICD-10-CM | POA: Diagnosis not present

## 2024-01-26 DIAGNOSIS — M25511 Pain in right shoulder: Secondary | ICD-10-CM | POA: Diagnosis not present

## 2024-01-26 DIAGNOSIS — E7800 Pure hypercholesterolemia, unspecified: Secondary | ICD-10-CM | POA: Diagnosis not present

## 2024-01-26 DIAGNOSIS — G4700 Insomnia, unspecified: Secondary | ICD-10-CM | POA: Diagnosis not present

## 2024-01-26 DIAGNOSIS — R7303 Prediabetes: Secondary | ICD-10-CM | POA: Diagnosis not present

## 2024-01-26 DIAGNOSIS — N816 Rectocele: Secondary | ICD-10-CM | POA: Diagnosis not present

## 2024-01-26 DIAGNOSIS — N3941 Urge incontinence: Secondary | ICD-10-CM | POA: Diagnosis not present

## 2024-01-26 DIAGNOSIS — M150 Primary generalized (osteo)arthritis: Secondary | ICD-10-CM | POA: Diagnosis not present

## 2024-01-26 DIAGNOSIS — Z23 Encounter for immunization: Secondary | ICD-10-CM | POA: Diagnosis not present

## 2024-01-26 DIAGNOSIS — K219 Gastro-esophageal reflux disease without esophagitis: Secondary | ICD-10-CM | POA: Diagnosis not present

## 2024-01-26 DIAGNOSIS — R159 Full incontinence of feces: Secondary | ICD-10-CM | POA: Diagnosis not present

## 2024-01-26 DIAGNOSIS — R42 Dizziness and giddiness: Secondary | ICD-10-CM | POA: Diagnosis not present

## 2024-01-26 DIAGNOSIS — Z9889 Other specified postprocedural states: Secondary | ICD-10-CM | POA: Diagnosis not present
